# Patient Record
Sex: Female | Born: 1945 | Race: Black or African American | Hispanic: No | Smoking: Never smoker
Health system: Southern US, Community
[De-identification: ages and names within clinical notes are randomized; demographics above are authoritative.]

## PROBLEM LIST (undated history)

## (undated) DIAGNOSIS — I350 Nonrheumatic aortic (valve) stenosis: Secondary | ICD-10-CM

## (undated) DIAGNOSIS — I10 Essential (primary) hypertension: Secondary | ICD-10-CM

## (undated) DIAGNOSIS — M1990 Unspecified osteoarthritis, unspecified site: Secondary | ICD-10-CM

## (undated) DIAGNOSIS — I214 Non-ST elevation (NSTEMI) myocardial infarction: Secondary | ICD-10-CM

## (undated) DIAGNOSIS — K5790 Diverticulosis of intestine, part unspecified, without perforation or abscess without bleeding: Secondary | ICD-10-CM

## (undated) DIAGNOSIS — I639 Cerebral infarction, unspecified: Secondary | ICD-10-CM

## (undated) DIAGNOSIS — E119 Type 2 diabetes mellitus without complications: Secondary | ICD-10-CM

## (undated) HISTORY — PX: ABDOMINAL SURGERY: SHX537

---

## 2007-04-26 ENCOUNTER — Emergency Department (HOSPITAL_COMMUNITY): Admission: EM | Admit: 2007-04-26 | Discharge: 2007-04-26 | Payer: Self-pay | Admitting: Emergency Medicine

## 2008-02-03 ENCOUNTER — Ambulatory Visit (HOSPITAL_BASED_OUTPATIENT_CLINIC_OR_DEPARTMENT_OTHER): Admission: RE | Admit: 2008-02-03 | Discharge: 2008-02-03 | Payer: Self-pay | Admitting: Internal Medicine

## 2011-04-19 LAB — URINALYSIS, ROUTINE W REFLEX MICROSCOPIC
Bilirubin Urine: NEGATIVE
Glucose, UA: NEGATIVE
Hgb urine dipstick: NEGATIVE
Ketones, ur: NEGATIVE
Nitrite: NEGATIVE
Protein, ur: NEGATIVE
Specific Gravity, Urine: 1.019
Urobilinogen, UA: 0.2
pH: 5

## 2011-04-19 LAB — I-STAT 8, (EC8 V) (CONVERTED LAB)
Bicarbonate: 26.1 — ABNORMAL HIGH
Glucose, Bld: 87
Operator id: 291361
Potassium: 4.4
pCO2, Ven: 47.6

## 2011-04-19 LAB — URINE MICROSCOPIC-ADD ON

## 2011-04-19 LAB — POCT CARDIAC MARKERS
CKMB, poc: 1.1
CKMB, poc: <1 — ABNORMAL LOW
Myoglobin, poc: 72.8
Operator id: 291361
Troponin i, poc: <0.05
Troponin i, poc: <0.05

## 2011-04-19 LAB — POCT I-STAT CREATININE: Operator id: 291361

## 2012-06-01 ENCOUNTER — Encounter (HOSPITAL_BASED_OUTPATIENT_CLINIC_OR_DEPARTMENT_OTHER): Payer: Self-pay

## 2012-06-01 ENCOUNTER — Emergency Department (HOSPITAL_BASED_OUTPATIENT_CLINIC_OR_DEPARTMENT_OTHER): Payer: Medicare Other

## 2012-06-01 ENCOUNTER — Emergency Department (HOSPITAL_BASED_OUTPATIENT_CLINIC_OR_DEPARTMENT_OTHER)
Admission: EM | Admit: 2012-06-01 | Discharge: 2012-06-01 | Disposition: A | Discharge status: Home / Self Care | Payer: Medicare Other | Attending: Emergency Medicine | Admitting: Emergency Medicine

## 2012-06-01 DIAGNOSIS — Z8719 Personal history of other diseases of the digestive system: Secondary | ICD-10-CM | POA: Insufficient documentation

## 2012-06-01 DIAGNOSIS — J029 Acute pharyngitis, unspecified: Secondary | ICD-10-CM | POA: Insufficient documentation

## 2012-06-01 DIAGNOSIS — Z79899 Other long term (current) drug therapy: Secondary | ICD-10-CM | POA: Insufficient documentation

## 2012-06-01 DIAGNOSIS — Z8739 Personal history of other diseases of the musculoskeletal system and connective tissue: Secondary | ICD-10-CM | POA: Insufficient documentation

## 2012-06-01 DIAGNOSIS — Z8673 Personal history of transient ischemic attack (TIA), and cerebral infarction without residual deficits: Secondary | ICD-10-CM | POA: Insufficient documentation

## 2012-06-01 DIAGNOSIS — E119 Type 2 diabetes mellitus without complications: Secondary | ICD-10-CM | POA: Insufficient documentation

## 2012-06-01 DIAGNOSIS — J40 Bronchitis, not specified as acute or chronic: Secondary | ICD-10-CM

## 2012-06-01 DIAGNOSIS — I10 Essential (primary) hypertension: Secondary | ICD-10-CM | POA: Insufficient documentation

## 2012-06-01 HISTORY — DX: Diverticulosis of intestine, part unspecified, without perforation or abscess without bleeding: K57.90

## 2012-06-01 HISTORY — DX: Type 2 diabetes mellitus without complications: E11.9

## 2012-06-01 HISTORY — DX: Unspecified osteoarthritis, unspecified site: M19.90

## 2012-06-01 HISTORY — DX: Essential (primary) hypertension: I10

## 2012-06-01 HISTORY — DX: Cerebral infarction, unspecified: I63.9

## 2012-06-01 MED ORDER — ALBUTEROL SULFATE HFA 108 (90 BASE) MCG/ACT IN AERS
2.0000 | INHALATION_SPRAY | RESPIRATORY_TRACT | Status: DC | PRN
  Administered 2012-06-01: 2 via RESPIRATORY_TRACT
  Filled 2012-06-01: qty 6.7

## 2012-06-01 NOTE — ED Notes (Signed)
Pt c/o of cough x 1 week.brownish, thick, along with sore throat,can't sleep

## 2012-06-01 NOTE — ED Provider Notes (Signed)
History     CSN: [id number]  Arrival date & time 06/01/12  0403   First MD Initiated Contact with Patient 06/01/12 [phone]      Chief Complaint  Patient presents with  . Cough    (Consider location/radiation/quality/duration/timing/severity/associated sxs/prior treatment) HPI Pt presents with c/o cough and sore throat.  She states she has had cough productive of thick sputum over the past week. No fever.  Has felt that her voice is hoarse but states this is improving.  No shortness of breath, no chest pain.  Has been drinking plenty of liquids, no vomiting or change in appetite.  Was having a difficult time sleeping tonight due to cough.  There are no other associated systemic symptoms, there are no other alleviating or modifying factors.   Past Medical History  Diagnosis Date  . Diabetes mellitus without complication   . Hypertension   . Stroke   . Arthritis   . Diverticulosis     History reviewed. No pertinent past surgical history.  No family history on file.  History  Substance Use Topics  . Smoking status: Not on file  . Smokeless tobacco: Not on file  . Alcohol Use:     OB History    Grav Para Term Preterm Abortions TAB SAB Ect Mult Living                  Review of Systems ROS reviewed and all otherwise negative except for mentioned in HPI  Allergies  Shrimp and Penicillins  Home Medications   Current Outpatient Rx  Name  Route  Sig  Dispense  Refill  . METFORMIN HCL 500 MG PO TABS   Oral   Take 500 mg by mouth 2 (two) times daily with a meal.           BP 145/85  Temp 98.6 F (37 C) (Oral)  Resp 24  Ht 5\' 4"  (1.626 m)  Wt 237 lb (107.502 kg)  BMI 40.68 kg/m2  SpO2 98% Vitals reviewed Physical Exam Physical Examination: General appearance - alert, well appearing, and in no distress Mental status - alert, oriented to person, place, and time Eyes - no scleral icterus, no conjunctival injection Mouth - mucous membranes moist, pharynx normal  without lesions Chest - clear to auscultation, no wheezes, rales or rhonchi, symmetric air entry, no increased respiratory effort Heart - normal rate, regular rhythm, normal S1, S2, no murmurs, rubs, clicks or gallops Abdomen - soft, nontender, nondistended, no masses or organomegaly Extremities - peripheral pulses normal, no pedal edema, no clubbing or cyanosis Skin - normal coloration and turgor, no rashes  ED Course  Procedures (including critical care time)  Labs Reviewed - No data to display Dg Chest 2 View  06/01/2012  *RADIOLOGY REPORT*  Clinical Data: Cough for 1 week.  Sore throat.  CHEST - 2 VIEW  Comparison: 04/1978 pain  Findings: Shallow inspiration. The heart size and pulmonary vascularity are normal. The lungs appear clear and expanded without focal air space disease or consolidation. No blunting of the costophrenic angles.  No pneumothorax.  Mediastinal contours appear intact.  Degenerative changes in the thoracic spine with anterior wedging of a mid thoracic vertebra, stable since previous study.  IMPRESSION: No evidence of active pulmonary disease.   Original Report Authenticated By: Burman Nieves, M.D.      1. Bronchitis       MDM  Pt presenting with c/o productive cough over the past week- overall nontoxic appearing with clear lungs  and no increased respiratory effort.  CXR reassuring- images reviewed by me as well.  Pt started on albuterol inhaler for likely bronchitis.  She will arrange for PMD follow upin the next 2-3 days if symptoms persist.         Ethelda Chick, MD 06/01/12 [phone]

## 2015-09-09 ENCOUNTER — Encounter (HOSPITAL_COMMUNITY): Payer: Self-pay | Admitting: Emergency Medicine

## 2015-09-09 ENCOUNTER — Emergency Department (HOSPITAL_COMMUNITY)
Admission: EM | Admit: 2015-09-09 | Discharge: 2015-09-09 | Disposition: A | Discharge status: Home / Self Care | Payer: Commercial Managed Care - HMO | Attending: Emergency Medicine | Admitting: Emergency Medicine

## 2015-09-09 ENCOUNTER — Emergency Department (HOSPITAL_COMMUNITY): Payer: Commercial Managed Care - HMO

## 2015-09-09 DIAGNOSIS — J209 Acute bronchitis, unspecified: Secondary | ICD-10-CM | POA: Insufficient documentation

## 2015-09-09 DIAGNOSIS — Z8673 Personal history of transient ischemic attack (TIA), and cerebral infarction without residual deficits: Secondary | ICD-10-CM | POA: Diagnosis not present

## 2015-09-09 DIAGNOSIS — R531 Weakness: Secondary | ICD-10-CM

## 2015-09-09 DIAGNOSIS — R011 Cardiac murmur, unspecified: Secondary | ICD-10-CM | POA: Diagnosis not present

## 2015-09-09 DIAGNOSIS — E119 Type 2 diabetes mellitus without complications: Secondary | ICD-10-CM | POA: Insufficient documentation

## 2015-09-09 DIAGNOSIS — Z79899 Other long term (current) drug therapy: Secondary | ICD-10-CM | POA: Diagnosis not present

## 2015-09-09 DIAGNOSIS — R61 Generalized hyperhidrosis: Secondary | ICD-10-CM | POA: Diagnosis not present

## 2015-09-09 DIAGNOSIS — R05 Cough: Secondary | ICD-10-CM | POA: Diagnosis not present

## 2015-09-09 DIAGNOSIS — R509 Fever, unspecified: Secondary | ICD-10-CM | POA: Diagnosis not present

## 2015-09-09 DIAGNOSIS — I10 Essential (primary) hypertension: Secondary | ICD-10-CM | POA: Diagnosis not present

## 2015-09-09 DIAGNOSIS — Z8719 Personal history of other diseases of the digestive system: Secondary | ICD-10-CM | POA: Diagnosis not present

## 2015-09-09 DIAGNOSIS — Z880 Allergy status to penicillin: Secondary | ICD-10-CM | POA: Diagnosis not present

## 2015-09-09 DIAGNOSIS — Z7984 Long term (current) use of oral hypoglycemic drugs: Secondary | ICD-10-CM | POA: Insufficient documentation

## 2015-09-09 DIAGNOSIS — Z8739 Personal history of other diseases of the musculoskeletal system and connective tissue: Secondary | ICD-10-CM | POA: Insufficient documentation

## 2015-09-09 DIAGNOSIS — R404 Transient alteration of awareness: Secondary | ICD-10-CM | POA: Diagnosis not present

## 2015-09-09 DIAGNOSIS — J40 Bronchitis, not specified as acute or chronic: Secondary | ICD-10-CM

## 2015-09-09 LAB — CBC WITH DIFFERENTIAL/PLATELET
Basophils Absolute: 0 10*3/uL (ref 0.0–0.1)
Basophils Relative: 0 %
EOS PCT: 3 %
Eosinophils Absolute: 0.2 10*3/uL (ref 0.0–0.7)
HCT: 40.2 % (ref 36.0–46.0)
Hemoglobin: 13.7 g/dL (ref 12.0–15.0)
LYMPHS ABS: 2.3 10*3/uL (ref 0.7–4.0)
Lymphocytes Relative: 34 %
MCH: 27.7 pg (ref 26.0–34.0)
MCHC: 34.1 g/dL (ref 30.0–36.0)
MCV: 81.4 fL (ref 78.0–100.0)
MONO ABS: 0.3 10*3/uL (ref 0.1–1.0)
Monocytes Relative: 5 %
Neutro Abs: 4 10*3/uL (ref 1.7–7.7)
Neutrophils Relative %: 58 %
PLATELETS: 277 10*3/uL (ref 150–400)
RBC: 4.94 MIL/uL (ref 3.87–5.11)
RDW: 15.1 % (ref 11.5–15.5)
WBC: 6.8 10*3/uL (ref 4.0–10.5)

## 2015-09-09 LAB — URINALYSIS, ROUTINE W REFLEX MICROSCOPIC
Bilirubin Urine: NEGATIVE
Glucose, UA: NEGATIVE mg/dL
Hgb urine dipstick: NEGATIVE
Ketones, ur: NEGATIVE mg/dL
LEUKOCYTES UA: NEGATIVE
NITRITE: NEGATIVE
Protein, ur: NEGATIVE mg/dL
SPECIFIC GRAVITY, URINE: 1.01 (ref 1.005–1.030)
pH: 7.5 (ref 5.0–8.0)

## 2015-09-09 LAB — COMPREHENSIVE METABOLIC PANEL
ALT: 17 U/L (ref 14–54)
AST: 23 U/L (ref 15–41)
Albumin: 3.7 g/dL (ref 3.5–5.0)
Alkaline Phosphatase: 121 U/L (ref 38–126)
Anion gap: 12 (ref 5–15)
BUN: 13 mg/dL (ref 6–20)
CHLORIDE: 105 mmol/L (ref 101–111)
CO2: 24 mmol/L (ref 22–32)
Calcium: 9.7 mg/dL (ref 8.9–10.3)
Creatinine, Ser: 0.99 mg/dL (ref 0.44–1.00)
GFR, EST NON AFRICAN AMERICAN: 57 mL/min — AB (ref >60)
Glucose, Bld: 103 mg/dL — ABNORMAL HIGH (ref 65–99)
POTASSIUM: 3.8 mmol/L (ref 3.5–5.1)
Sodium: 141 mmol/L (ref 135–145)
Total Bilirubin: 0.3 mg/dL (ref 0.3–1.2)
Total Protein: 8.1 g/dL (ref 6.5–8.1)

## 2015-09-09 LAB — TROPONIN I

## 2015-09-09 MED ORDER — AZITHROMYCIN 250 MG PO TABS: 250.0000 mg | ORAL_TABLET | Freq: Every day | ORAL | Status: DC

## 2015-09-09 MED ORDER — SODIUM CHLORIDE 0.9 % IV BOLUS (SEPSIS)
500.0000 mL | Freq: Once | INTRAVENOUS | Status: AC
  Administered 2015-09-09: 500 mL via INTRAVENOUS

## 2015-09-09 NOTE — ED Notes (Signed)
Pt ambulated to bathroom with stand by assist.  

## 2015-09-09 NOTE — ED Notes (Signed)
Patient transported to X-ray 

## 2015-09-09 NOTE — ED Notes (Signed)
PT reports chills, body aches, and weakness for 2 weeks. Non-productive cough. PT reports intermittent slight chest pain and intermittent slight SOB. PT reports no chest pain or SOB at this time. PT denies NVD

## 2015-09-09 NOTE — Discharge Instructions (Signed)
Read the information below.  Use the prescribed medication as directed.  Please discuss all new medications with your pharmacist.  You may return to the Emergency Department at any time for worsening condition or any new symptoms that concern you.   If you develop high fevers that do not resolve with tylenol or ibuprofen, you have difficulty swallowing or breathing, or you are unable to tolerate fluids by mouth, return to the ER for a recheck.      Weakness Weakness is a lack of strength. It may be felt all over the body (generalized) or in one specific part of the body (focal). Some causes of weakness can be serious. You may need further medical evaluation, especially if you are elderly or you have a history of immunosuppression (such as chemotherapy or HIV), kidney disease, heart disease, or diabetes. CAUSES  Weakness can be caused by many different things, including:  Infection.  Physical exhaustion.  Internal bleeding or other blood loss that results in a lack of red blood cells (anemia).  Dehydration. This cause is more common in elderly people.  Side effects or electrolyte abnormalities from medicines, such as pain medicines or sedatives.  Emotional distress, anxiety, or depression.  Circulation problems, especially severe peripheral arterial disease.  Heart disease, such as rapid atrial fibrillation, bradycardia, or heart failure.  Nervous system disorders, such as Guillain-Barr syndrome, multiple sclerosis, or stroke. DIAGNOSIS  To find the cause of your weakness, your caregiver will take your history and perform a physical exam. Lab tests or X-rays may also be ordered, if needed. TREATMENT  Treatment of weakness depends on the cause of your symptoms and can vary greatly. HOME CARE INSTRUCTIONS   Rest as needed.  Eat a well-balanced diet.  Try to get some exercise every day.  Only take over-the-counter or prescription medicines as directed by your caregiver. SEEK  MEDICAL CARE IF:   Your weakness seems to be getting worse or spreads to other parts of your body.  You develop new aches or pains. SEEK IMMEDIATE MEDICAL CARE IF:   You cannot perform your normal daily activities, such as getting dressed and feeding yourself.  You cannot walk up and down stairs, or you feel exhausted when you do so.  You have shortness of breath or chest pain.  You have difficulty moving parts of your body.  You have weakness in only one area of the body or on only one side of the body.  You have a fever.  You have trouble speaking or swallowing.  You cannot control your bladder or bowel movements.  You have black or bloody vomit or stools. MAKE SURE YOU:  Understand these instructions.  Will watch your condition.  Will get help right away if you are not doing well or get worse.   This information is not intended to replace advice given to you by your health care provider. Make sure you discuss any questions you have with your health care provider.   Document Released: 06/26/2005 Document Revised: 12/26/2011 Document Reviewed: 08/25/2011 Elsevier Interactive Patient Education 2016 [employer]  Upper Respiratory Infection, Adult Most upper respiratory infections (URIs) are a viral infection of the air passages leading to the lungs. A URI affects the nose, throat, and upper air passages. The most common type of URI is nasopharyngitis and is typically referred to as "the common cold." URIs run their course and usually go away on their own. Most of the time, a URI does not require medical attention, but sometimes  a bacterial infection in the upper airways can follow a viral infection. This is called a secondary infection. Sinus and middle ear infections are common types of secondary upper respiratory infections. Bacterial pneumonia can also complicate a URI. A URI can worsen asthma and chronic obstructive pulmonary disease (COPD). Sometimes, these  complications can require emergency medical care and may be life threatening.  CAUSES Almost all URIs are caused by viruses. A virus is a type of germ and can spread from one person to another.  RISKS FACTORS You may be at risk for a URI if:   You smoke.   You have chronic heart or lung disease.  You have a weakened defense (immune) system.   You are very young or very old.   You have nasal allergies or asthma.  You work in crowded or poorly ventilated areas.  You work in health care facilities or schools. SIGNS AND SYMPTOMS  Symptoms typically develop 2-3 days after you come in contact with a cold virus. Most viral URIs last 7-10 days. However, viral URIs from the influenza virus (flu virus) can last 14-18 days and are typically more severe. Symptoms may include:   Runny or stuffy (congested) nose.   Sneezing.   Cough.   Sore throat.   Headache.   Fatigue.   Fever.   Loss of appetite.   Pain in your forehead, behind your eyes, and over your cheekbones (sinus pain).  Muscle aches.  DIAGNOSIS  Your health care provider may diagnose a URI by:  Physical exam.  Tests to check that your symptoms are not due to another condition such as:  Strep throat.  Sinusitis.  Pneumonia.  Asthma. TREATMENT  A URI goes away on its own with time. It cannot be cured with medicines, but medicines may be prescribed or recommended to relieve symptoms. Medicines may help:  Reduce your fever.  Reduce your cough.  Relieve nasal congestion. HOME CARE INSTRUCTIONS   Take medicines only as directed by your health care provider.   Gargle warm saltwater or take cough drops to comfort your throat as directed by your health care provider.  Use a warm mist humidifier or inhale steam from a shower to increase air moisture. This may make it easier to breathe.  Drink enough fluid to keep your urine clear or pale yellow.   Eat soups and other clear broths and maintain  good nutrition.   Rest as needed.   Return to work when your temperature has returned to normal or as your health care provider advises. You may need to stay home longer to avoid infecting others. You can also use a face mask and careful hand washing to prevent spread of the virus.  Increase the usage of your inhaler if you have asthma.   Do not use any tobacco products, including cigarettes, chewing tobacco, or electronic cigarettes. If you need help quitting, ask your health care provider. PREVENTION  The best way to protect yourself from getting a cold is to practice good hygiene.   Avoid oral or hand contact with people with cold symptoms.   Wash your hands often if contact occurs.  There is no clear evidence that vitamin C, vitamin E, echinacea, or exercise reduces the chance of developing a cold. However, it is always recommended to get plenty of rest, exercise, and practice good nutrition.  SEEK MEDICAL CARE IF:   You are getting worse rather than better.   Your symptoms are not controlled by medicine.  You have chills.  You have worsening shortness of breath.  You have brown or red mucus.  You have yellow or brown nasal discharge.  You have pain in your face, especially when you bend forward.  You have a fever.  You have swollen neck glands.  You have pain while swallowing.  You have white areas in the back of your throat. SEEK IMMEDIATE MEDICAL CARE IF:   You have severe or persistent:  Headache.  Ear pain.  Sinus pain.  Chest pain.  You have chronic lung disease and any of the following:  Wheezing.  Prolonged cough.  Coughing up blood.  A change in your usual mucus.  You have a stiff neck.  You have changes in your:  Vision.  Hearing.  Thinking.  Mood. MAKE SURE YOU:   Understand these instructions.  Will watch your condition.  Will get help right away if you are not doing well or get worse.   This information is not  intended to replace advice given to you by your health care provider. Make sure you discuss any questions you have with your health care provider.   Document Released: 12/20/2000 Document Revised: 11/10/2014 Document Reviewed: 10/01/2013 Elsevier Interactive Patient Education [employer]

## 2015-09-09 NOTE — ED Provider Notes (Signed)
CSN: [id number]     Arrival date & time 09/09/15  1420 History   First MD Initiated Contact with Patient 09/09/15 1440     Chief Complaint  Patient presents with  . Weakness    chills, body aches     (Consider location/radiation/quality/duration/timing/severity/associated sxs/prior Treatment) HPI   Pt with hx DM, HTN, stroke p/w 10 days of generalized weakness, sweats, dry cough, nasal congestion, sore throat.  Few episodes of subjective fever last week.  This morning she was significantly weaker than she had been, decided to come to ED.  Daughter has been sick with similar, all developed after going to a baby shower.  Denies CP, SOB, N/V, bowel changes, urinary symptoms.  She did have flu vx this season.    Past Medical History  Diagnosis Date  . Diabetes mellitus without complication (HCC)   . Hypertension   . Stroke (HCC)   . Arthritis   . Diverticulosis    Past Surgical History  Procedure Laterality Date  . Abdominal surgery     No family history on file. Social History  Substance Use Topics  . Smoking status: Never Smoker   . Smokeless tobacco: None  . Alcohol Use: No   OB History    No data available     Review of Systems  All other systems reviewed and are negative.     Allergies  Shrimp and Penicillins  Home Medications   Prior to Admission medications   Medication Sig Start Date End Date Taking? Authorizing Provider  amLODipine (NORVASC) 5 MG tablet Take 5 mg by mouth daily.   Yes Historical Provider, MD  glipiZIDE (GLUCOTROL XL) 5 MG 24 hr tablet Take 5 mg by mouth daily with breakfast.   Yes Historical Provider, MD   BP 154/85 mmHg  Pulse 73  Temp(Src) 98.3 F (36.8 C)  Resp 15  Ht  (1.626 m)  Wt 104.327 kg  BMI 39.46 kg/m2  SpO2 99% Physical Exam  Constitutional: She appears well-developed and well-nourished. No distress.  HENT:  Head: Normocephalic and atraumatic.  Eyes: Conjunctivae are normal.  Neck: Normal range of motion. Neck  supple.  Cardiovascular: Normal rate and regular rhythm.   Murmur heard. Pulmonary/Chest: Effort normal and breath sounds normal. No respiratory distress. She has no wheezes. She has no rales.  Abdominal: Soft. She exhibits no distension. There is no tenderness. There is no rebound and no guarding.  Musculoskeletal: She exhibits no edema.  Neurological: She is alert.  Skin: She is not diaphoretic.  Nursing note and vitals reviewed.   ED Course  Procedures (including critical care time) Labs Review Labs Reviewed  COMPREHENSIVE METABOLIC PANEL - Abnormal; Notable for the following:    Glucose, Bld 103 (*)    GFR calc non Af Amer 57 (*)    All other components within normal limits  URINE CULTURE  CBC WITH DIFFERENTIAL/PLATELET  URINALYSIS, ROUTINE W REFLEX MICROSCOPIC (NOT AT [facility])  TROPONIN I    Imaging Review Dg Chest 2 View  09/09/2015  CLINICAL DATA:  Fever, congestion, nonproductive cough, weakness EXAM: CHEST  2 VIEW COMPARISON:  06/01/2012 FINDINGS: Cardiomediastinal silhouette is stable. No infiltrate or pleural effusion. No pulmonary edema. Mild basilar atelectasis right greater than left. Degenerative changes mid and lower thoracic spine. IMPRESSION: No infiltrate or pulmonary edema. Mild basilar atelectasis right greater than left. Degenerative changes thoracic spine. Electronically Signed   By: Natasha Mead M.D.   On: 09/09/2015 15:45   I have personally reviewed  and evaluated these images and lab results as part of my medical decision-making.   EKG Interpretation   Date/Time:  Thursday September 09 2015 14:28:29 EST Ventricular Rate:  74 PR Interval:  176 QRS Duration: 93 QT Interval:  382 QTC Calculation: 424 R Axis:   70 Text Interpretation:  Sinus rhythm No significant change was found  Confirmed by CAMPOS  MD, KEVIN ([id number]) on 09/09/2015 3:49:50 PM       6:17 PM Pt reports feeling much better.  Ambulated without difficulty, eating and drinking.    MDM   Final  diagnoses:  None    Afebrile, nontoxic patient with constellation of symptoms suggestive of viral syndrome with prolonged course in the setting of diabetes.  Daughter also sick with similar symptoms but has improved.  No concerning findings on exam - pt does have murmur on exam, unsure if this is a new finding, pt unaware of this previously.  Recommended cardiology follow up.  No syncope, CP, SOB per patient.  Discussed pt with Dr Patria Mane.  Discharged home with z-pak, PCP follow up.  Discussed result, findings, treatment, and follow up  with patient.  Pt given return precautions.  Pt verbalizes understanding and agrees with plan.        Trixie Dredge, PA-C 09/09/15 1828  Azalia Bilis, MD 09/10/15 [phone]

## 2015-09-09 NOTE — ED Notes (Signed)
Ambulated pt in the hallway pt ambulated well no complaints noted at this time 

## 2015-09-10 LAB — URINE CULTURE

## 2015-09-13 DIAGNOSIS — E119 Type 2 diabetes mellitus without complications: Secondary | ICD-10-CM | POA: Diagnosis not present

## 2015-09-13 DIAGNOSIS — E785 Hyperlipidemia, unspecified: Secondary | ICD-10-CM | POA: Diagnosis not present

## 2015-09-13 DIAGNOSIS — I10 Essential (primary) hypertension: Secondary | ICD-10-CM | POA: Diagnosis not present

## 2015-09-13 DIAGNOSIS — R011 Cardiac murmur, unspecified: Secondary | ICD-10-CM | POA: Diagnosis not present

## 2015-09-13 DIAGNOSIS — K219 Gastro-esophageal reflux disease without esophagitis: Secondary | ICD-10-CM | POA: Diagnosis not present

## 2015-09-13 DIAGNOSIS — M545 Low back pain: Secondary | ICD-10-CM | POA: Diagnosis not present

## 2015-09-27 DIAGNOSIS — I10 Essential (primary) hypertension: Secondary | ICD-10-CM | POA: Diagnosis not present

## 2015-09-27 DIAGNOSIS — K219 Gastro-esophageal reflux disease without esophagitis: Secondary | ICD-10-CM | POA: Diagnosis not present

## 2015-09-27 DIAGNOSIS — E785 Hyperlipidemia, unspecified: Secondary | ICD-10-CM | POA: Diagnosis not present

## 2015-09-27 DIAGNOSIS — M545 Low back pain: Secondary | ICD-10-CM | POA: Diagnosis not present

## 2015-09-27 DIAGNOSIS — R011 Cardiac murmur, unspecified: Secondary | ICD-10-CM | POA: Diagnosis not present

## 2015-09-27 DIAGNOSIS — E119 Type 2 diabetes mellitus without complications: Secondary | ICD-10-CM | POA: Diagnosis not present

## 2015-12-03 DIAGNOSIS — E785 Hyperlipidemia, unspecified: Secondary | ICD-10-CM | POA: Diagnosis not present

## 2015-12-03 DIAGNOSIS — R011 Cardiac murmur, unspecified: Secondary | ICD-10-CM | POA: Diagnosis not present

## 2015-12-03 DIAGNOSIS — I10 Essential (primary) hypertension: Secondary | ICD-10-CM | POA: Diagnosis not present

## 2015-12-03 DIAGNOSIS — K219 Gastro-esophageal reflux disease without esophagitis: Secondary | ICD-10-CM | POA: Diagnosis not present

## 2015-12-03 DIAGNOSIS — M545 Low back pain: Secondary | ICD-10-CM | POA: Diagnosis not present

## 2015-12-03 DIAGNOSIS — E119 Type 2 diabetes mellitus without complications: Secondary | ICD-10-CM | POA: Diagnosis not present

## 2016-01-09 DIAGNOSIS — E669 Obesity, unspecified: Secondary | ICD-10-CM | POA: Diagnosis not present

## 2016-01-09 DIAGNOSIS — M19041 Primary osteoarthritis, right hand: Secondary | ICD-10-CM | POA: Diagnosis not present

## 2016-01-09 DIAGNOSIS — Z8673 Personal history of transient ischemic attack (TIA), and cerebral infarction without residual deficits: Secondary | ICD-10-CM | POA: Diagnosis not present

## 2016-01-09 DIAGNOSIS — M19042 Primary osteoarthritis, left hand: Secondary | ICD-10-CM | POA: Diagnosis not present

## 2016-01-09 DIAGNOSIS — Z6839 Body mass index (BMI) 39.0-39.9, adult: Secondary | ICD-10-CM | POA: Diagnosis not present

## 2016-01-09 DIAGNOSIS — K219 Gastro-esophageal reflux disease without esophagitis: Secondary | ICD-10-CM | POA: Diagnosis not present

## 2016-01-09 DIAGNOSIS — E119 Type 2 diabetes mellitus without complications: Secondary | ICD-10-CM | POA: Diagnosis not present

## 2016-01-09 DIAGNOSIS — E785 Hyperlipidemia, unspecified: Secondary | ICD-10-CM | POA: Diagnosis not present

## 2016-01-09 DIAGNOSIS — I10 Essential (primary) hypertension: Secondary | ICD-10-CM | POA: Diagnosis not present

## 2016-03-14 DIAGNOSIS — Z0000 Encounter for general adult medical examination without abnormal findings: Secondary | ICD-10-CM | POA: Diagnosis not present

## 2016-03-14 DIAGNOSIS — K219 Gastro-esophageal reflux disease without esophagitis: Secondary | ICD-10-CM | POA: Diagnosis not present

## 2016-03-14 DIAGNOSIS — E785 Hyperlipidemia, unspecified: Secondary | ICD-10-CM | POA: Diagnosis not present

## 2016-03-14 DIAGNOSIS — H538 Other visual disturbances: Secondary | ICD-10-CM | POA: Diagnosis not present

## 2016-03-14 DIAGNOSIS — E119 Type 2 diabetes mellitus without complications: Secondary | ICD-10-CM | POA: Diagnosis not present

## 2016-03-14 DIAGNOSIS — Z01118 Encounter for examination of ears and hearing with other abnormal findings: Secondary | ICD-10-CM | POA: Diagnosis not present

## 2016-03-14 DIAGNOSIS — I10 Essential (primary) hypertension: Secondary | ICD-10-CM | POA: Diagnosis not present

## 2016-03-14 DIAGNOSIS — M545 Low back pain: Secondary | ICD-10-CM | POA: Diagnosis not present

## 2016-05-23 DIAGNOSIS — F329 Major depressive disorder, single episode, unspecified: Secondary | ICD-10-CM | POA: Diagnosis not present

## 2016-05-23 DIAGNOSIS — E785 Hyperlipidemia, unspecified: Secondary | ICD-10-CM | POA: Diagnosis not present

## 2016-05-23 DIAGNOSIS — I10 Essential (primary) hypertension: Secondary | ICD-10-CM | POA: Diagnosis not present

## 2016-05-23 DIAGNOSIS — K219 Gastro-esophageal reflux disease without esophagitis: Secondary | ICD-10-CM | POA: Diagnosis not present

## 2016-05-23 DIAGNOSIS — M545 Low back pain: Secondary | ICD-10-CM | POA: Diagnosis not present

## 2016-05-23 DIAGNOSIS — E119 Type 2 diabetes mellitus without complications: Secondary | ICD-10-CM | POA: Diagnosis not present

## 2016-08-29 DIAGNOSIS — E785 Hyperlipidemia, unspecified: Secondary | ICD-10-CM | POA: Diagnosis not present

## 2016-08-29 DIAGNOSIS — M545 Low back pain: Secondary | ICD-10-CM | POA: Diagnosis not present

## 2016-08-29 DIAGNOSIS — F329 Major depressive disorder, single episode, unspecified: Secondary | ICD-10-CM | POA: Diagnosis not present

## 2016-08-29 DIAGNOSIS — Z0000 Encounter for general adult medical examination without abnormal findings: Secondary | ICD-10-CM | POA: Diagnosis not present

## 2016-08-29 DIAGNOSIS — K219 Gastro-esophageal reflux disease without esophagitis: Secondary | ICD-10-CM | POA: Diagnosis not present

## 2016-08-29 DIAGNOSIS — I10 Essential (primary) hypertension: Secondary | ICD-10-CM | POA: Diagnosis not present

## 2016-08-29 DIAGNOSIS — E119 Type 2 diabetes mellitus without complications: Secondary | ICD-10-CM | POA: Diagnosis not present

## 2016-09-26 DIAGNOSIS — K219 Gastro-esophageal reflux disease without esophagitis: Secondary | ICD-10-CM | POA: Diagnosis not present

## 2016-09-26 DIAGNOSIS — F329 Major depressive disorder, single episode, unspecified: Secondary | ICD-10-CM | POA: Diagnosis not present

## 2016-09-26 DIAGNOSIS — E119 Type 2 diabetes mellitus without complications: Secondary | ICD-10-CM | POA: Diagnosis not present

## 2016-09-26 DIAGNOSIS — E785 Hyperlipidemia, unspecified: Secondary | ICD-10-CM | POA: Diagnosis not present

## 2016-09-26 DIAGNOSIS — I10 Essential (primary) hypertension: Secondary | ICD-10-CM | POA: Diagnosis not present

## 2016-09-26 DIAGNOSIS — M545 Low back pain: Secondary | ICD-10-CM | POA: Diagnosis not present

## 2016-11-21 DIAGNOSIS — H2513 Age-related nuclear cataract, bilateral: Secondary | ICD-10-CM | POA: Diagnosis not present

## 2016-11-21 DIAGNOSIS — H40033 Anatomical narrow angle, bilateral: Secondary | ICD-10-CM | POA: Diagnosis not present

## 2016-11-27 DIAGNOSIS — H43393 Other vitreous opacities, bilateral: Secondary | ICD-10-CM | POA: Diagnosis not present

## 2017-02-13 DIAGNOSIS — F329 Major depressive disorder, single episode, unspecified: Secondary | ICD-10-CM | POA: Diagnosis not present

## 2017-02-13 DIAGNOSIS — I10 Essential (primary) hypertension: Secondary | ICD-10-CM | POA: Diagnosis not present

## 2017-02-13 DIAGNOSIS — Z0000 Encounter for general adult medical examination without abnormal findings: Secondary | ICD-10-CM | POA: Diagnosis not present

## 2017-02-13 DIAGNOSIS — Z01118 Encounter for examination of ears and hearing with other abnormal findings: Secondary | ICD-10-CM | POA: Diagnosis not present

## 2017-02-13 DIAGNOSIS — K219 Gastro-esophageal reflux disease without esophagitis: Secondary | ICD-10-CM | POA: Diagnosis not present

## 2017-02-13 DIAGNOSIS — M545 Low back pain: Secondary | ICD-10-CM | POA: Diagnosis not present

## 2017-02-13 DIAGNOSIS — E119 Type 2 diabetes mellitus without complications: Secondary | ICD-10-CM | POA: Diagnosis not present

## 2017-02-13 DIAGNOSIS — E785 Hyperlipidemia, unspecified: Secondary | ICD-10-CM | POA: Diagnosis not present

## 2017-02-13 DIAGNOSIS — Z136 Encounter for screening for cardiovascular disorders: Secondary | ICD-10-CM | POA: Diagnosis not present

## 2017-03-19 DIAGNOSIS — L309 Dermatitis, unspecified: Secondary | ICD-10-CM | POA: Diagnosis not present

## 2017-03-19 DIAGNOSIS — M545 Low back pain: Secondary | ICD-10-CM | POA: Diagnosis not present

## 2017-03-19 DIAGNOSIS — K219 Gastro-esophageal reflux disease without esophagitis: Secondary | ICD-10-CM | POA: Diagnosis not present

## 2017-03-19 DIAGNOSIS — E785 Hyperlipidemia, unspecified: Secondary | ICD-10-CM | POA: Diagnosis not present

## 2017-03-19 DIAGNOSIS — E119 Type 2 diabetes mellitus without complications: Secondary | ICD-10-CM | POA: Diagnosis not present

## 2017-03-19 DIAGNOSIS — F329 Major depressive disorder, single episode, unspecified: Secondary | ICD-10-CM | POA: Diagnosis not present

## 2017-03-19 DIAGNOSIS — I10 Essential (primary) hypertension: Secondary | ICD-10-CM | POA: Diagnosis not present

## 2017-06-25 DIAGNOSIS — M545 Low back pain: Secondary | ICD-10-CM | POA: Diagnosis not present

## 2017-06-25 DIAGNOSIS — E119 Type 2 diabetes mellitus without complications: Secondary | ICD-10-CM | POA: Diagnosis not present

## 2017-06-25 DIAGNOSIS — R5383 Other fatigue: Secondary | ICD-10-CM | POA: Diagnosis not present

## 2017-06-25 DIAGNOSIS — K219 Gastro-esophageal reflux disease without esophagitis: Secondary | ICD-10-CM | POA: Diagnosis not present

## 2017-06-25 DIAGNOSIS — F329 Major depressive disorder, single episode, unspecified: Secondary | ICD-10-CM | POA: Diagnosis not present

## 2017-06-25 DIAGNOSIS — E785 Hyperlipidemia, unspecified: Secondary | ICD-10-CM | POA: Diagnosis not present

## 2017-06-25 DIAGNOSIS — I10 Essential (primary) hypertension: Secondary | ICD-10-CM | POA: Diagnosis not present

## 2017-08-30 DIAGNOSIS — K219 Gastro-esophageal reflux disease without esophagitis: Secondary | ICD-10-CM | POA: Diagnosis not present

## 2017-08-30 DIAGNOSIS — E785 Hyperlipidemia, unspecified: Secondary | ICD-10-CM | POA: Diagnosis not present

## 2017-08-30 DIAGNOSIS — Z0000 Encounter for general adult medical examination without abnormal findings: Secondary | ICD-10-CM | POA: Diagnosis not present

## 2017-08-30 DIAGNOSIS — M545 Low back pain: Secondary | ICD-10-CM | POA: Diagnosis not present

## 2017-08-30 DIAGNOSIS — F329 Major depressive disorder, single episode, unspecified: Secondary | ICD-10-CM | POA: Diagnosis not present

## 2017-08-30 DIAGNOSIS — I10 Essential (primary) hypertension: Secondary | ICD-10-CM | POA: Diagnosis not present

## 2017-08-30 DIAGNOSIS — E119 Type 2 diabetes mellitus without complications: Secondary | ICD-10-CM | POA: Diagnosis not present

## 2017-11-20 DIAGNOSIS — E785 Hyperlipidemia, unspecified: Secondary | ICD-10-CM | POA: Diagnosis not present

## 2017-11-20 DIAGNOSIS — F329 Major depressive disorder, single episode, unspecified: Secondary | ICD-10-CM | POA: Diagnosis not present

## 2017-11-20 DIAGNOSIS — K219 Gastro-esophageal reflux disease without esophagitis: Secondary | ICD-10-CM | POA: Diagnosis not present

## 2017-11-20 DIAGNOSIS — M545 Low back pain: Secondary | ICD-10-CM | POA: Diagnosis not present

## 2017-11-20 DIAGNOSIS — E119 Type 2 diabetes mellitus without complications: Secondary | ICD-10-CM | POA: Diagnosis not present

## 2017-11-20 DIAGNOSIS — I10 Essential (primary) hypertension: Secondary | ICD-10-CM | POA: Diagnosis not present

## 2017-11-20 DIAGNOSIS — Z0000 Encounter for general adult medical examination without abnormal findings: Secondary | ICD-10-CM | POA: Diagnosis not present

## 2017-12-21 DIAGNOSIS — M545 Low back pain: Secondary | ICD-10-CM | POA: Diagnosis not present

## 2017-12-21 DIAGNOSIS — F329 Major depressive disorder, single episode, unspecified: Secondary | ICD-10-CM | POA: Diagnosis not present

## 2017-12-21 DIAGNOSIS — I10 Essential (primary) hypertension: Secondary | ICD-10-CM | POA: Diagnosis not present

## 2017-12-21 DIAGNOSIS — E119 Type 2 diabetes mellitus without complications: Secondary | ICD-10-CM | POA: Diagnosis not present

## 2017-12-21 DIAGNOSIS — E785 Hyperlipidemia, unspecified: Secondary | ICD-10-CM | POA: Diagnosis not present

## 2017-12-21 DIAGNOSIS — K219 Gastro-esophageal reflux disease without esophagitis: Secondary | ICD-10-CM | POA: Diagnosis not present

## 2017-12-28 ENCOUNTER — Encounter (HOSPITAL_COMMUNITY): Payer: Self-pay | Admitting: Emergency Medicine

## 2017-12-28 ENCOUNTER — Other Ambulatory Visit: Payer: Self-pay

## 2017-12-28 ENCOUNTER — Emergency Department (HOSPITAL_COMMUNITY)
Admission: EM | Admit: 2017-12-28 | Discharge: 2017-12-29 | Disposition: A | Discharge status: Home / Self Care | Payer: Medicare HMO | Attending: Emergency Medicine | Admitting: Emergency Medicine

## 2017-12-28 ENCOUNTER — Emergency Department (HOSPITAL_COMMUNITY): Payer: Medicare HMO

## 2017-12-28 DIAGNOSIS — R000 Tachycardia, unspecified: Secondary | ICD-10-CM | POA: Diagnosis present

## 2017-12-28 DIAGNOSIS — I10 Essential (primary) hypertension: Secondary | ICD-10-CM | POA: Insufficient documentation

## 2017-12-28 DIAGNOSIS — R51 Headache: Secondary | ICD-10-CM | POA: Diagnosis not present

## 2017-12-28 DIAGNOSIS — E119 Type 2 diabetes mellitus without complications: Secondary | ICD-10-CM | POA: Insufficient documentation

## 2017-12-28 DIAGNOSIS — Z79899 Other long term (current) drug therapy: Secondary | ICD-10-CM | POA: Insufficient documentation

## 2017-12-28 DIAGNOSIS — R0602 Shortness of breath: Secondary | ICD-10-CM | POA: Diagnosis not present

## 2017-12-28 DIAGNOSIS — R42 Dizziness and giddiness: Secondary | ICD-10-CM | POA: Diagnosis not present

## 2017-12-28 LAB — CBC
HEMATOCRIT: 43.5 % (ref 36.0–46.0)
Hemoglobin: 13.9 g/dL (ref 12.0–15.0)
MCH: 27.1 pg (ref 26.0–34.0)
MCHC: 32 g/dL (ref 30.0–36.0)
MCV: 85 fL (ref 78.0–100.0)
Platelets: 247 10*3/uL (ref 150–400)
RBC: 5.12 MIL/uL — ABNORMAL HIGH (ref 3.87–5.11)
RDW: 15.1 % (ref 11.5–15.5)
WBC: 8 10*3/uL (ref 4.0–10.5)

## 2017-12-28 LAB — BASIC METABOLIC PANEL
ANION GAP: 10 (ref 5–15)
BUN: 15 mg/dL (ref 6–20)
CALCIUM: 9.8 mg/dL (ref 8.9–10.3)
CO2: 24 mmol/L (ref 22–32)
Chloride: 105 mmol/L (ref 101–111)
Creatinine, Ser: 1.11 mg/dL — ABNORMAL HIGH (ref 0.44–1.00)
GFR calc Af Amer: 57 mL/min — ABNORMAL LOW (ref >60)
GFR calc non Af Amer: 49 mL/min — ABNORMAL LOW (ref >60)
Glucose, Bld: 120 mg/dL — ABNORMAL HIGH (ref 65–99)
POTASSIUM: 3.9 mmol/L (ref 3.5–5.1)
SODIUM: 139 mmol/L (ref 135–145)

## 2017-12-28 LAB — I-STAT TROPONIN, ED: TROPONIN I, POC: 0 ng/mL (ref 0.00–0.08)

## 2017-12-28 NOTE — ED Triage Notes (Signed)
C/o blood pressure elevated, heart racing, mild sob, and feeling lightheaded since Tuesday.  Reports intermittent frontal headache and L shoulder pain since yesterday.  Taking blood pressure medication as instructed.

## 2017-12-29 NOTE — Discharge Instructions (Signed)
Return for worsening shortness of breath, chest pain, headache, or new stroke symptoms(one sided weakness, trouble with speech or swallowing, visual changes) Otherwise see you doctor and discuss your blood pressure management.

## 2017-12-29 NOTE — ED Provider Notes (Addendum)
[facility] EMERGENCY DEPARTMENT Provider Note   CSN: [id number] Arrival date & time: 12/28/17  2026     History   Chief Complaint Chief Complaint  Patient presents with  . Tachycardia  . Hypertension    HPI Vicki Rogers is a 72 y.o. female.  72 yo F with a chief complaint of hypertension.  The patient has been checking her blood pressure all week and feels it is been high.  She is been taking her medication as prescribed and it has not gone down.  She has had a very mild left-sided headache that she says is chronic.  She also has had some lightheadedness.  Is usually worse upon standing.  She is also been short of breath on exertion for the past week or so.  She denies any difficulty with speech or swallowing denies unilateral weakness.  Denies chest pain denies abdominal pain vomiting or diarrhea.  Denies diaphoresis nausea or vomiting on exertion.  She has not been eating and drinking because she was worried that it would influence her blood pressure.  The history is provided by the patient.  Hypertension  This is a new problem. The current episode started more than 1 week ago. The problem occurs constantly. The problem has not changed since onset.Associated symptoms include headaches and shortness of breath. Pertinent negatives include no chest pain. Nothing aggravates the symptoms. Nothing relieves the symptoms. She has tried nothing for the symptoms. The treatment provided no relief.    Past Medical History:  Diagnosis Date  . Arthritis   . Diabetes mellitus without complication (HCC)   . Diverticulosis   . Hypertension   . Stroke [facility])     There are no active problems to display for this patient.   Past Surgical History:  Procedure Laterality Date  . ABDOMINAL SURGERY       OB History   None      Home Medications    Prior to Admission medications   Medication Sig Start Date End Date Taking? Authorizing Provider  amLODipine (NORVASC) 5 MG  tablet Take 5 mg by mouth daily.   Yes [provider]  carboxymethylcellul-glycerin (OPTIVE) 0.5-0.9 % ophthalmic solution Place 1 drop into both eyes as needed for dry eyes.   Yes [provider]  glipiZIDE (GLUCOTROL XL) 5 MG 24 hr tablet Take 5 mg by mouth daily with breakfast.   Yes [provider]  oxyCODONE (OXY IR/ROXICODONE) 5 MG immediate release tablet Take 5 mg by mouth daily as needed for pain. 11/20/17  Yes [provider]  azithromycin (ZITHROMAX) 250 MG tablet Take 1 tablet (250 mg total) by mouth daily. Take first 2 tablets together, then 1 every day until finished. Patient not taking: Reported on 12/28/2017 09/09/15   Trixie Dredge, PA-C    Family History No family history on file.  Social History Social History   Tobacco Use  . Smoking status: Never Smoker  . Smokeless tobacco: Never Used  Substance Use Topics  . Alcohol use: No  . Drug use: No     Allergies   Penicillins and Shrimp [shellfish allergy]   Review of Systems Review of Systems  Constitutional: Negative for chills and fever.  HENT: Negative for congestion and rhinorrhea.   Eyes: Negative for redness and visual disturbance.  Respiratory: Positive for shortness of breath. Negative for wheezing.   Cardiovascular: Negative for chest pain and palpitations.  Gastrointestinal: Negative for nausea and vomiting.  Genitourinary: Negative for dysuria and urgency.  Musculoskeletal: Negative for arthralgias and myalgias.  Skin: Negative for pallor and wound.  Neurological: Positive for headaches. Negative for dizziness.     Physical Exam Updated Vital Signs BP 120/70   Pulse 68   Temp 98 F (36.7 C) (Oral)   Resp 14   SpO2 97%   Physical Exam  Constitutional: She is oriented to person, place, and time. She appears well-developed and well-nourished. No distress.  HENT:  Head: Normocephalic and atraumatic.  Eyes: Pupils are equal, round, and reactive to light. EOM  are normal.  Neck: Normal range of motion. Neck supple.  Cardiovascular: Normal rate and regular rhythm. Exam reveals no gallop and no friction rub.  No murmur heard. Pulmonary/Chest: Effort normal. She has no wheezes. She has no rales.  Abdominal: Soft. She exhibits no distension and no mass. There is no tenderness. There is no guarding.  Musculoskeletal: She exhibits no edema or tenderness.  Neurological: She is alert and oriented to person, place, and time. No cranial nerve deficit or sensory deficit. Coordination and gait normal. GCS eye subscore is 4. GCS verbal subscore is 5. GCS motor subscore is 6.  LUE weakness 4/5 compared to right.  Chronic per patient  Skin: Skin is warm and dry. She is not diaphoretic.  Psychiatric: She has a normal mood and affect. Her behavior is normal.  Nursing note and vitals reviewed.    ED Treatments / Results  Labs (all labs ordered are listed, but only abnormal results are displayed) Labs Reviewed  BASIC METABOLIC PANEL - Abnormal; Notable for the following components:      Result Value   Glucose, Bld 120 (*)    Creatinine, Ser 1.11 (*)    GFR calc non Af Amer 49 (*)    GFR calc Af Amer 57 (*)    All other components within normal limits  CBC - Abnormal; Notable for the following components:   RBC 5.12 (*)    All other components within normal limits  I-STAT TROPONIN, ED    EKG EKG Interpretation  Date/Time:  Friday December 28 2017 20:41:00 EDT Ventricular Rate:  76 PR Interval:  182 QRS Duration: 90 QT Interval:  368 QTC Calculation: 414 R Axis:   46 Text Interpretation:  Normal sinus rhythm Normal ECG No significant change was found Confirmed by Azalia Bilis ([id number]) on 12/28/2017 11:26:25 PM   Radiology Dg Chest 2 View  Result Date: 12/28/2017 CLINICAL DATA:  Elevated blood pressure. Heart racing. Shortness of breath, and lightheaded since Tuesday. Frontal headache and left shoulder pain since yesterday. EXAM: CHEST - 2 VIEW  COMPARISON:  09/09/2015 FINDINGS: Normal heart size and pulmonary vascularity. No focal airspace disease or consolidation in the lungs. No blunting of costophrenic angles. No pneumothorax. Mediastinal contours appear intact. Calcification of the aorta. Degenerative changes in the thoracic spine with midthoracic kyphosis. No change since prior study. IMPRESSION: No active cardiopulmonary disease.  Aortic atherosclerosis. Electronically Signed   By: Burman Nieves M.D.   On: 12/28/2017 21:42    Procedures Procedures (including critical care time)  Medications Ordered in ED Medications - No data to display   Initial Impression / Assessment and Plan / ED Course  I have reviewed the triage vital signs and the nursing notes.  Pertinent labs & imaging results that were available during my care of the patient were reviewed by me and considered in my medical decision making (see chart for details).     72 yo F with a chief complaint of  hypertension.  Patient is well-appearing and nontoxic.  Upon my evaluation the room the patient's blood pressure is 130/77.  She had a laboratory evaluation done in triage that was unremarkable, creatinine is at baseline not anemic troponin negative.  I offered to do a repeat troponin but the patient was upset when I told her that I was not can to treat her blood pressure here.  I told her that she needs to follow-up with her family physician or whoever manages her blood pressure and I discussed reasons why we should not treat her blood pressure in the emergency department and discussed the current ACEP policy.  The patient then did not feel she needed to wait for a second troponin.  She will follow-up with her family physician.  2:07 AM:  I have discussed the diagnosis/risks/treatment options with the patient and family and believe the pt to be eligible for discharge home to follow-up with PCP. We also discussed returning to the ED immediately if new or worsening sx  occur. We discussed the sx which are most concerning (e.g., sudden worsening pain, fever, inability to tolerate by mouth) that necessitate immediate return. Medications administered to the patient during their visit and any new prescriptions provided to the patient are listed below.  Medications given during this visit Medications - No data to display    The patient appears reasonably screen and/or stabilized for discharge and I doubt any other medical condition or other [facility] requiring further screening, evaluation, or treatment in the ED at this time prior to discharge.    Final Clinical Impressions(s) / ED Diagnoses   Final diagnoses:  Essential hypertension    ED Discharge Orders    None       Melene PlanFloyd, Tydus Sanmiguel, DO 12/29/17 0207    Melene PlanFloyd, Gardner Servantes, DO 12/29/17 0207

## 2018-04-07 ENCOUNTER — Other Ambulatory Visit: Payer: Self-pay

## 2018-04-07 ENCOUNTER — Encounter (HOSPITAL_COMMUNITY): Payer: Self-pay

## 2018-04-07 ENCOUNTER — Inpatient Hospital Stay (HOSPITAL_COMMUNITY)
Admission: EM | Admit: 2018-04-07 | Discharge: 2018-04-09 | DRG: 247 | Disposition: A | Discharge status: Home Health | Payer: Medicare HMO | Attending: Family Medicine | Admitting: Family Medicine

## 2018-04-07 ENCOUNTER — Emergency Department (HOSPITAL_COMMUNITY): Payer: Medicare HMO

## 2018-04-07 DIAGNOSIS — E669 Obesity, unspecified: Secondary | ICD-10-CM | POA: Diagnosis present

## 2018-04-07 DIAGNOSIS — I214 Non-ST elevation (NSTEMI) myocardial infarction: Secondary | ICD-10-CM | POA: Diagnosis not present

## 2018-04-07 DIAGNOSIS — E118 Type 2 diabetes mellitus with unspecified complications: Secondary | ICD-10-CM

## 2018-04-07 DIAGNOSIS — Z955 Presence of coronary angioplasty implant and graft: Secondary | ICD-10-CM | POA: Diagnosis not present

## 2018-04-07 DIAGNOSIS — I6523 Occlusion and stenosis of bilateral carotid arteries: Secondary | ICD-10-CM | POA: Diagnosis not present

## 2018-04-07 DIAGNOSIS — Z91013 Allergy to seafood: Secondary | ICD-10-CM | POA: Diagnosis not present

## 2018-04-07 DIAGNOSIS — Z880 Allergy status to penicillin: Secondary | ICD-10-CM

## 2018-04-07 DIAGNOSIS — Z8719 Personal history of other diseases of the digestive system: Secondary | ICD-10-CM | POA: Diagnosis not present

## 2018-04-07 DIAGNOSIS — E785 Hyperlipidemia, unspecified: Secondary | ICD-10-CM | POA: Diagnosis not present

## 2018-04-07 DIAGNOSIS — R748 Abnormal levels of other serum enzymes: Secondary | ICD-10-CM

## 2018-04-07 DIAGNOSIS — I700 Atherosclerosis of aorta: Secondary | ICD-10-CM | POA: Diagnosis present

## 2018-04-07 DIAGNOSIS — Z888 Allergy status to other drugs, medicaments and biological substances status: Secondary | ICD-10-CM | POA: Diagnosis not present

## 2018-04-07 DIAGNOSIS — I723 Aneurysm of iliac artery: Secondary | ICD-10-CM | POA: Diagnosis not present

## 2018-04-07 DIAGNOSIS — R1013 Epigastric pain: Secondary | ICD-10-CM | POA: Diagnosis not present

## 2018-04-07 DIAGNOSIS — I350 Nonrheumatic aortic (valve) stenosis: Secondary | ICD-10-CM | POA: Diagnosis present

## 2018-04-07 DIAGNOSIS — I4581 Long QT syndrome: Secondary | ICD-10-CM | POA: Diagnosis present

## 2018-04-07 DIAGNOSIS — Z833 Family history of diabetes mellitus: Secondary | ICD-10-CM

## 2018-04-07 DIAGNOSIS — I213 ST elevation (STEMI) myocardial infarction of unspecified site: Secondary | ICD-10-CM | POA: Diagnosis not present

## 2018-04-07 DIAGNOSIS — K219 Gastro-esophageal reflux disease without esophagitis: Secondary | ICD-10-CM | POA: Diagnosis present

## 2018-04-07 DIAGNOSIS — I719 Aortic aneurysm of unspecified site, without rupture: Secondary | ICD-10-CM

## 2018-04-07 DIAGNOSIS — I491 Atrial premature depolarization: Secondary | ICD-10-CM | POA: Diagnosis not present

## 2018-04-07 DIAGNOSIS — Z7984 Long term (current) use of oral hypoglycemic drugs: Secondary | ICD-10-CM | POA: Diagnosis not present

## 2018-04-07 DIAGNOSIS — R0602 Shortness of breath: Secondary | ICD-10-CM | POA: Diagnosis not present

## 2018-04-07 DIAGNOSIS — Z79899 Other long term (current) drug therapy: Secondary | ICD-10-CM

## 2018-04-07 DIAGNOSIS — E1165 Type 2 diabetes mellitus with hyperglycemia: Secondary | ICD-10-CM | POA: Diagnosis not present

## 2018-04-07 DIAGNOSIS — R079 Chest pain, unspecified: Secondary | ICD-10-CM | POA: Diagnosis present

## 2018-04-07 DIAGNOSIS — R0789 Other chest pain: Secondary | ICD-10-CM

## 2018-04-07 DIAGNOSIS — I69334 Monoplegia of upper limb following cerebral infarction affecting left non-dominant side: Secondary | ICD-10-CM

## 2018-04-07 DIAGNOSIS — Z6834 Body mass index (BMI) 34.0-34.9, adult: Secondary | ICD-10-CM

## 2018-04-07 DIAGNOSIS — I119 Hypertensive heart disease without heart failure: Secondary | ICD-10-CM | POA: Diagnosis present

## 2018-04-07 DIAGNOSIS — I2510 Atherosclerotic heart disease of native coronary artery without angina pectoris: Secondary | ICD-10-CM | POA: Diagnosis not present

## 2018-04-07 HISTORY — DX: Nonrheumatic aortic (valve) stenosis: I35.0

## 2018-04-07 HISTORY — DX: Non-ST elevation (NSTEMI) myocardial infarction: I21.4

## 2018-04-07 LAB — COMPREHENSIVE METABOLIC PANEL
ALBUMIN: 3.7 g/dL (ref 3.5–5.0)
ALT: 16 U/L (ref 0–44)
AST: 22 U/L (ref 15–41)
Alkaline Phosphatase: 119 U/L (ref 38–126)
Anion gap: 9 (ref 5–15)
BILIRUBIN TOTAL: 0.5 mg/dL (ref 0.3–1.2)
BUN: 15 mg/dL (ref 8–23)
CHLORIDE: 104 mmol/L (ref 98–111)
CO2: 25 mmol/L (ref 22–32)
CREATININE: 0.93 mg/dL (ref 0.44–1.00)
Calcium: 9.6 mg/dL (ref 8.9–10.3)
GFR calc Af Amer: >60 mL/min (ref >60)
Glucose, Bld: 165 mg/dL — ABNORMAL HIGH (ref 70–99)
POTASSIUM: 3.8 mmol/L (ref 3.5–5.1)
Sodium: 138 mmol/L (ref 135–145)
Total Protein: 8 g/dL (ref 6.5–8.1)

## 2018-04-07 LAB — CBC WITH DIFFERENTIAL/PLATELET
ABS IMMATURE GRANULOCYTES: 0 10*3/uL (ref 0.0–0.1)
Basophils Absolute: 0 10*3/uL (ref 0.0–0.1)
Basophils Relative: 0 %
Eosinophils Absolute: 0.1 10*3/uL (ref 0.0–0.7)
Eosinophils Relative: 1 %
HCT: 42 % (ref 36.0–46.0)
HEMOGLOBIN: 13.7 g/dL (ref 12.0–15.0)
IMMATURE GRANULOCYTES: 1 %
LYMPHS ABS: 1.6 10*3/uL (ref 0.7–4.0)
LYMPHS PCT: 18 %
MCH: 27.1 pg (ref 26.0–34.0)
MCHC: 32.6 g/dL (ref 30.0–36.0)
MCV: 83 fL (ref 78.0–100.0)
MONO ABS: 0.5 10*3/uL (ref 0.1–1.0)
MONOS PCT: 5 %
NEUTROS ABS: 6.7 10*3/uL (ref 1.7–7.7)
NEUTROS PCT: 75 %
Platelets: 273 10*3/uL (ref 150–400)
RBC: 5.06 MIL/uL (ref 3.87–5.11)
RDW: 14.7 % (ref 11.5–15.5)
WBC: 8.8 10*3/uL (ref 4.0–10.5)

## 2018-04-07 LAB — I-STAT CHEM 8, ED
BUN: 17 mg/dL (ref 8–23)
CHLORIDE: 102 mmol/L (ref 98–111)
Calcium, Ion: 1.18 mmol/L (ref 1.15–1.40)
Creatinine, Ser: 0.9 mg/dL (ref 0.44–1.00)
GLUCOSE: 167 mg/dL — AB (ref 70–99)
HCT: 43 % (ref 36.0–46.0)
Hemoglobin: 14.6 g/dL (ref 12.0–15.0)
POTASSIUM: 3.8 mmol/L (ref 3.5–5.1)
Sodium: 141 mmol/L (ref 135–145)
TCO2: 26 mmol/L (ref 22–32)

## 2018-04-07 LAB — I-STAT TROPONIN, ED: TROPONIN I, POC: 0.36 ng/mL — AB (ref 0.00–0.08)

## 2018-04-07 LAB — TROPONIN I: TROPONIN I: 1.13 ng/mL — AB (ref <0.03)

## 2018-04-07 LAB — LIPASE, BLOOD: Lipase: 37 U/L (ref 11–51)

## 2018-04-07 LAB — D-DIMER, QUANTITATIVE (NOT AT ARMC): D DIMER QUANT: 0.73 ug{FEU}/mL — AB (ref 0.00–0.50)

## 2018-04-07 MED ORDER — IOPAMIDOL (ISOVUE-370) INJECTION 76%
100.0000 mL | Freq: Once | INTRAVENOUS | Status: AC | PRN
  Administered 2018-04-07: 100 mL via INTRAVENOUS

## 2018-04-07 MED ORDER — MORPHINE SULFATE (PF) 2 MG/ML IV SOLN
2.0000 mg | Freq: Once | INTRAVENOUS | Status: AC
  Administered 2018-04-07: 2 mg via INTRAVENOUS
  Filled 2018-04-07: qty 1

## 2018-04-07 MED ORDER — HEPARIN BOLUS VIA INFUSION
4000.0000 [IU] | Freq: Once | INTRAVENOUS | Status: AC
  Administered 2018-04-07: 4000 [IU] via INTRAVENOUS
  Filled 2018-04-07: qty 4000

## 2018-04-07 MED ORDER — ACETAMINOPHEN 650 MG RE SUPP: 650.0000 mg | Freq: Four times a day (QID) | RECTAL | Status: DC | PRN

## 2018-04-07 MED ORDER — ACETAMINOPHEN 325 MG PO TABS: 650.0000 mg | ORAL_TABLET | Freq: Four times a day (QID) | ORAL | Status: DC | PRN

## 2018-04-07 MED ORDER — INSULIN ASPART 100 UNIT/ML ~~LOC~~ SOLN
0.0000 [IU] | Freq: Three times a day (TID) | SUBCUTANEOUS | Status: DC
  Administered 2018-04-09: 1 [IU] via SUBCUTANEOUS

## 2018-04-07 MED ORDER — ONDANSETRON HCL 4 MG/2ML IJ SOLN
4.0000 mg | Freq: Once | INTRAMUSCULAR | Status: AC
  Administered 2018-04-07: 4 mg via INTRAVENOUS
  Filled 2018-04-07: qty 2

## 2018-04-07 MED ORDER — SENNOSIDES-DOCUSATE SODIUM 8.6-50 MG PO TABS: 1.0000 | ORAL_TABLET | Freq: Every evening | ORAL | Status: DC | PRN

## 2018-04-07 MED ORDER — AMLODIPINE BESYLATE 5 MG PO TABS
5.0000 mg | ORAL_TABLET | Freq: Every day | ORAL | Status: DC
Start: 2018-04-08 — End: 2018-04-09
  Administered 2018-04-08 – 2018-04-09 (×2): 5 mg via ORAL
  Filled 2018-04-07 (×2): qty 1

## 2018-04-07 MED ORDER — IOPAMIDOL (ISOVUE-370) INJECTION 76%
INTRAVENOUS | Status: AC
  Filled 2018-04-07: qty 100

## 2018-04-07 MED ORDER — HEPARIN (PORCINE) IN NACL 100-0.45 UNIT/ML-% IJ SOLN
1000.0000 [IU]/h | INTRAMUSCULAR | Status: DC
  Administered 2018-04-07: 1000 [IU]/h via INTRAVENOUS
  Filled 2018-04-07: qty 250

## 2018-04-07 NOTE — ED Notes (Signed)
Family insisting patient have some water and crackers.  Reiterated Dr. Madilyn Hook instructions of prefer pt not to eat and drink until all scans are complete.  Dr. Madilyn Hook did advise pt and family that this is her recommendation.  Gave pt/family crackers and water per their request.

## 2018-04-07 NOTE — H&P (Addendum)
Family Medicine Teaching [facility] Admission History and Physical Service Pager: [phone]  Patient name: Vicki Rogers Medical record number: [id number] Date of birth: September 26, 1945 Age: 72 y.o. Gender: female  Primary Care Provider: Jackie Plum, MD Consultants: cardiology Code Status: FULL (discussed on admission)   Chief Complaint: chest pain, abdominal pain   Assessment and Plan: Vicki Rogers is a 72 y.o. female presenting with chest and abdominal pain. PMH is significant for DM-II, HTN, HLD, GERD, h/o CVA, h/o of diverticulosis.   Chest and abdominal pain - On arrival to ED patient hypertensive to 161/102 with otherwise stable vitals.  Complained of abdominal pain and chest pain this morning around 1130am. The pain did not radiate to her back or down her arm, she did not have diaphoresis, palpitations or vision changes.  She did have vomiting and headache after she was given SL nitroglycerin en route by EMS.  Her troponin level in the ED was 1.13 with no ST elevations noted on EKG. Cardiology saw the patient while in the ED and noted minimally elevated ST segments and believed her symptoms to be more GI related.  CTA did not show dissection or PE. Patient given morphine in the ED which provided some relief in her symptoms.  Do not suspect acute pancreatitis given normal lipase of 37 and patient not c/o nausea/vomiting. Abdominal exam notable for epigastric tenderness without rebound or guarding and negative Murphy's sign. Do not suspect costochondritis as CP not reproducible on exam.  Will admit for further workup and ACS r/o.   - admit to stepdown, attending Dr. Lum Babe  - cardiac monitoring  - heparin drip  -cards will re-evaluate in AM, f/u recs -  NPO at midnight in anticipation of possible AM cath  - AM EKG  - BMP, CBC  - trend troponins - zofran prn for nausea -f/u TSH, A1c, lipid panel  -vitals per unit routine  T2DM - patient takes glipizide 5 mg daily at home. No  previous A1c on file. Cbg in hospital 167.   - repeat A1c  - hold glipizide for now - sSSI -monitor CBGs   HTN - BP on admission 161/102 with some improvement to 140-150 sbp with nitroglycerin.   - continue home amlodipine 5mg   HLD - not on medication at home, she reports she used to be on a statin but stopped around last year - f/u lipid panel   H/o CVA in 1995. Stable.  Residual weakness in her left arm sometimes.   -continue to monitor   H/o diverticulosis.  Last time this bothered her was 60.    GERD. Stable. At home on omeprazole.   -continue to monitor   FEN/GI: heart healthy, NPO at [place] Prophylaxis: heparin infusion  Disposition: Admit to stepdown, attending Dr. Lum Babe   History of Present Illness:  Vicki Rogers is a 72 y.o. female presenting with CP and abdominal pain that began around 1130 AM this morning.  She was trying to make her breakfast when this occurred.  Abdominal pain localized to RUQ below her breast.  Pain 10/10, dull/achy in nature and does not radiate. No diaphoresis, dizziness or vision changes.  Called her grandson and came to ED via EMS, received nitro had some nausea and vomiting in the ED.  Has not eaten anything since this morning.  No diarrhea, constipation or blood in stool. No fevers, chills.  Has never had this type of abdominal or chest pain before.  She feels her abdominal pain may be from not eating  anything.  No recent illnesses.  She took glipizide and amlodipine when pain began but no other medications.  Currently pain is better controlled and is about 3/10.  No urinary symptoms. No history of abdominal surgery.   Review Of Systems: Per HPI with the following additions:  Review of Systems  Constitutional: Negative for chills and fever.  HENT: Negative for congestion.   Eyes: Negative for blurred vision.  Respiratory: Negative for cough and shortness of breath.   Cardiovascular: Positive for chest pain. Negative for palpitations and leg  swelling.  Gastrointestinal: Positive for abdominal pain. Negative for blood in stool, constipation, diarrhea and heartburn.  Neurological: Negative for dizziness.   There are no active problems to display for this patient.   Past Medical History: Past Medical History:  Diagnosis Date  . Arthritis   . Diabetes mellitus without complication (HCC)   . Diverticulosis   . Hypertension   . Stroke [facility])    Past Surgical History: Past Surgical History:  Procedure Laterality Date  . ABDOMINAL SURGERY     Social History: Social History   Tobacco Use  . Smoking status: Never Smoker  . Smokeless tobacco: Never Used  Substance Use Topics  . Alcohol use: No  . Drug use: No   Additional social history: Lives at home by herself.  No history of tobacco, alcohol or illicit drug use.   Please also refer to relevant sections of EMR.  Family History: Family History  Problem Relation Age of Onset  . Diabetes Mother    Allergies and Medications: Allergies  Allergen Reactions  . Penicillins Anaphylaxis    Has patient had a PCN reaction causing immediate rash, facial/tongue/throat swelling, SOB or lightheadedness with hypotension: YES Has patient had a PCN reaction causing severe rash involving mucus membranes or skin necrosis:NO Has patient had a PCN reaction that required hospitalization YES Has patient had a PCN reaction occurring within the last 10 years: NO If all of the above answers are "NO", then may proceed with Cephalosporin use.  . Shrimp [Shellfish Allergy] Anaphylaxis   No current facility-administered medications on file prior to encounter.    Current Outpatient Medications on File Prior to Encounter  Medication Sig Dispense Refill  . amLODipine (NORVASC) 5 MG tablet Take 5 mg by mouth daily.    . carboxymethylcellul-glycerin (OPTIVE) 0.5-0.9 % ophthalmic solution Place 1 drop into both eyes as needed for dry eyes.    Marland Kitchen glipiZIDE (GLUCOTROL XL) 5 MG 24 hr tablet Take 5  mg by mouth daily with breakfast.    . OMEPRAZOLE PO Take 1 capsule by mouth daily as needed (heartburn).     Objective: BP (!) 147/84 (BP Location: Left Arm)   Pulse 70   Temp 98.8 F (37.1 C) (Oral)   Resp 15   Ht 5\' 5"  (1.651 m)   Wt 94.5 kg   SpO2 95%   BMI 34.68 kg/m  Exam: General: patient is alert and oriented.  Laying in bed.  NAD Eyes: PERRL, EOMI  ENTM: MMM.  No oropharyngeal swelling or erythema.  Neck: supple, no lymphadenopathy Cardiovascular: irregular rhythm. Normal rate.  Systolic murmur heard. Grade 3/6.  CP not reproducible on exam with palpation over chest wall.  Respiratory: CTAB, normal effort on 2L per [place]  Gastrointestinal: soft, mildly tender to palpation in RUQ.  No rebound or guarding present. Negative Murphy's. Normal bowel sounds.  MSK: 5/5 strength in lower extremities.  Derm: warm, dry, no rashes. Not diaphoretic.  Psych: pleasant affect.  Cooperative with exam.   Labs and Imaging: CBC BMET  Recent Labs  Lab 04/08/18 0416  WBC 13.8*  HGB 12.8  HCT 39.5  PLT 329   Recent Labs  Lab 04/08/18 0416  NA 140  K 3.4*  CL 105  CO2 24  BUN 18  CREATININE 1.10*  GLUCOSE 51*  CALCIUM 9.7     Frederic Jericho, MD  PGY-1, [facility] Family Medicine FPTS Intern pager: [phone], text pages welcome  I have seen and evaluated the above patient with Dr. Constance Goltz and agree with his documentation.  I am including my edits in blue.   Freddrick March MD [facility] PGY3

## 2018-04-07 NOTE — Progress Notes (Signed)
ANTICOAGULATION CONSULT NOTE - Initial Consult  Pharmacy Consult for Heparin Indication: chest pain/ACS  Allergies  Allergen Reactions  . Penicillins Anaphylaxis    Has patient had a PCN reaction causing immediate rash, facial/tongue/throat swelling, SOB or lightheadedness with hypotension: YES Has patient had a PCN reaction causing severe rash involving mucus membranes or skin necrosis:NO Has patient had a PCN reaction that required hospitalization YES Has patient had a PCN reaction occurring within the last 10 years: NO If all of the above answers are "NO", then may proceed with Cephalosporin use.  . Shrimp [Shellfish Allergy] Anaphylaxis    Patient Measurements: Height: 5\' 5"  (165.1 cm) Weight: 242 lb (109.8 kg) IBW/kg (Calculated) : 57 Heparin Dosing Weight: 82.8 kg  Vital Signs: Temp: 98.5 F (36.9 C) (09/29 1511) BP: 142/83 (09/29 1945) Pulse Rate: 86 (09/29 1945)  Labs: Recent Labs    04/07/18 1610 04/07/18 1623 04/07/18 1900  HGB 13.7 14.6  --   HCT 42.0 43.0  --   PLT 273  --   --   CREATININE 0.93 0.90  --   TROPONINI  --   --  1.13*    Estimated Creatinine Clearance: 70.7 mL/min (by C-G formula based on SCr of 0.9 mg/dL).   Medical History: Past Medical History:  Diagnosis Date  . Arthritis   . Diabetes mellitus without complication (HCC)   . Diverticulosis   . Hypertension   . Stroke [facility])     Assessment: 62 YOF who presented on 9/29 with CP. Cards consulted and per their initial evaluation appeared to be more GI related and advised against anticoagulation. Troponins have continued to trend up and now ED-MD is requesting to start Heparin for anticoagulation while awaiting cardiology re-evaluation in the AM.  The patient was not on anticoagulation PTA. Hx CVA noted - but not w/in the past 3 months. Baseline CBC wnl.   Goal of Therapy:  Heparin level 0.3-0.7 units/ml Monitor platelets by anticoagulation protocol: Yes   Plan:  - Heparin 4000  unit bolus - Start Heparin at a rate of 1000 units/hr - Daily HL, CBC - Will continue to monitor for any signs/symptoms of bleeding and will follow up with heparin level in 6 hours   Thank you for allowing pharmacy to be a part of this patient's care.  Georgina Pillion, PharmD, BCPS Clinical Pharmacist Pager: [phone] Please check AMION for all [facility] Pharmacy numbers 04/07/2018 9:27 PM

## 2018-04-07 NOTE — ED Notes (Signed)
Lab called to see if add on possible for D-Dimer. Lab to run D-Dimer on tube in lab. Will add-on.

## 2018-04-07 NOTE — ED Provider Notes (Signed)
[facility] EMERGENCY DEPARTMENT Provider Note   CSN: [id number] Arrival date & time: 04/07/18  1508     History   Chief Complaint Chief Complaint  Patient presents with  . Chest Pain    HPI Vicki Rogers is a 72 y.o. female.  The history is provided by the patient. No language interpreter was used.  Chest Pain     Vicki Rogers is a 72 y.o. female who presents to the Emergency Department complaining of chest pain. He presents to the emergency department by EMS for evaluation of central chest pain that began about 1130 today. Pain is waxing and waning with episodes lasting about five minutes. She has associated epigastric pain. Pain is described as aching in nature. She has a one episode of nausea and vomiting. She denies any fevers, cough, diarrhea, leg swelling or pain. No prior similar symptoms. She has hypertension, hyperlipidemia as well as diabetes.  Sxs are severe in nature.   Past Medical History:  Diagnosis Date  . Arthritis   . Diabetes mellitus without complication (HCC)   . Diverticulosis   . Hypertension   . Stroke [facility])     There are no active problems to display for this patient.   Past Surgical History:  Procedure Laterality Date  . ABDOMINAL SURGERY       OB History   None      Home Medications    Prior to Admission medications   Medication Sig Start Date End Date Taking? Authorizing Provider  amLODipine (NORVASC) 5 MG tablet Take 5 mg by mouth daily.   Yes [provider]  carboxymethylcellul-glycerin (OPTIVE) 0.5-0.9 % ophthalmic solution Place 1 drop into both eyes as needed for dry eyes.   Yes [provider]  glipiZIDE (GLUCOTROL XL) 5 MG 24 hr tablet Take 5 mg by mouth daily with breakfast.   Yes [provider]  OMEPRAZOLE PO Take 1 capsule by mouth daily as needed (heartburn).   Yes [provider]    Family History Family History  Problem Relation Age of Onset  . Diabetes Mother      Social History Social History   Tobacco Use  . Smoking status: Never Smoker  . Smokeless tobacco: Never Used  Substance Use Topics  . Alcohol use: No  . Drug use: No     Allergies   Penicillins and Shrimp [shellfish allergy]   Review of Systems Review of Systems  Cardiovascular: Positive for chest pain.  All other systems reviewed and are negative.    Physical Exam Updated Vital Signs BP 127/75   Pulse 70   Temp 98.5 F (36.9 C)   Resp 17   Ht 5\' 5"  (1.651 m)   Wt 109.8 kg   SpO2 96%   BMI 40.27 kg/m   Physical Exam  Constitutional: She is oriented to person, place, and time. She appears well-developed and well-nourished.  HENT:  Head: Normocephalic and atraumatic.  Cardiovascular: Normal rate and regular rhythm.  SEM  Pulmonary/Chest: Effort normal and breath sounds normal. No respiratory distress.  Abdominal: Soft. There is no rebound and no guarding.  Mild epigastric tenderness  Musculoskeletal: She exhibits no edema or tenderness.  2+ DP and radial pulses bilaterally  Neurological: She is alert and oriented to person, place, and time.  Skin: Skin is warm and dry.  Psychiatric: She has a normal mood and affect. Her behavior is normal.  Nursing note and vitals reviewed.    ED Treatments / Results  Labs (all labs ordered are listed, but only abnormal results are displayed) Labs Reviewed  COMPREHENSIVE METABOLIC PANEL - Abnormal; Notable for the following components:      Result Value   Glucose, Bld 165 (*)    All other components within normal limits  D-DIMER, QUANTITATIVE (NOT AT [facility]) - Abnormal; Notable for the following components:   D-Dimer, Quant 0.73 (*)    All other components within normal limits  TROPONIN I - Abnormal; Notable for the following components:   Troponin I 1.13 (*)    All other components within normal limits  I-STAT CHEM 8, ED - Abnormal; Notable for the following components:   Glucose, Bld 167 (*)    All other  components within normal limits  I-STAT TROPONIN, ED - Abnormal; Notable for the following components:   Troponin i, poc 0.36 (*)    All other components within normal limits  LIPASE, BLOOD  CBC WITH DIFFERENTIAL/PLATELET  HEPARIN LEVEL (UNFRACTIONATED)  CBC    EKG EKG Interpretation  Date/Time:  Sunday April 07 2018 21:10:28 EDT Ventricular Rate:  78 PR Interval:    QRS Duration: 88 QT Interval:  384 QTC Calculation: 438 R Axis:   51 Text Interpretation:  Sinus rhythm Borderline T wave abnormalities Confirmed by Tilden Fossa [phone]) on 04/07/2018 9:17:09 PM   Radiology Dg Chest Port 1 View  Result Date: 04/07/2018 CLINICAL DATA:  Chest pain EXAM: PORTABLE CHEST 1 VIEW COMPARISON:  December 28, 2017 FINDINGS: There is mild interstitial thickening, stable. There is no frank edema or consolidation. Heart is borderline enlarged with pulmonary vascularity normal. No adenopathy. There is aortic atherosclerosis. There is bilateral carotid artery calcification. No bone lesions. IMPRESSION: Mild interstitial thickening without frank edema or consolidation. Mild cardiac enlargement. No adenopathy evident. There is aortic atherosclerosis as well as foci of calcification in both carotid arteries. Aortic Atherosclerosis (ICD10-I70.0). Electronically Signed   By: Bretta Bang III M.D.   On: 04/07/2018 15:47   Ct Angio Chest/abd/pel For Dissection W And/or W/wo  Result Date: 04/07/2018 CLINICAL DATA:  Chest and abdominal pain. EXAM: CT ANGIOGRAPHY CHEST, ABDOMEN AND PELVIS TECHNIQUE: Multidetector CT imaging through the chest, abdomen and pelvis was performed using the standard protocol during bolus administration of intravenous contrast. Multiplanar reconstructed images and MIPs were obtained and reviewed to evaluate the vascular anatomy. CONTRAST:  ISOVUE-370 IOPAMIDOL (ISOVUE-370) INJECTION 76% COMPARISON:  Radiograph of same day. FINDINGS: CTA CHEST FINDINGS Cardiovascular:  Preferential opacification of the thoracic aorta. No evidence of thoracic aortic aneurysm or dissection. Normal heart size. No pericardial effusion. Mediastinum/Nodes: No enlarged mediastinal, hilar, or axillary lymph nodes. Thyroid gland, trachea, and esophagus demonstrate no significant findings. Lungs/Pleura: Lungs are clear. No pleural effusion or pneumothorax. Musculoskeletal: Moderate thoracic kyphosis is noted. No acute abnormality is noted. Review of the MIP images confirms the above findings. CTA ABDOMEN AND PELVIS FINDINGS VASCULAR Aorta: Atherosclerosis of abdominal aorta is noted without aneurysm formation. 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 Celiac: Patent without evidence of aneurysm, dissection, vasculitis or significant stenosis. SMA: Patent without evidence of aneurysm, dissection, vasculitis or significant stenosis. Renals: Both renal arteries are patent without evidence of aneurysm, dissection, vasculitis, fibromuscular dysplasia or significant stenosis. IMA: Patent without evidence of aneurysm, dissection, vasculitis or significant stenosis. 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 No significant stenosis or other abnormality seen involving the iliac arteries. Veins: No obvious venous abnormality within the limitations of this arterial  phase study. Review of the MIP images confirms the above findings. NON-VASCULAR Hepatobiliary: No focal liver abnormality is seen. No gallstones, gallbladder wall thickening, or biliary dilatation. Pancreas: Unremarkable. No pancreatic ductal dilatation or surrounding inflammatory changes. Spleen: Normal in size without focal abnormality. Adrenals/Urinary Tract: Adrenal glands are unremarkable. Kidneys are normal, without renal calculi, focal lesion, or hydronephrosis. Bladder is unremarkable. Stomach/Bowel: Stomach is within normal limits. Appendix appears normal. No evidence  of bowel wall thickening, distention, or inflammatory changes. Lymphatic: No significant adenopathy is noted. Reproductive: Uterus and bilateral adnexa are unremarkable. Other: No abdominal wall hernia or abnormality. No abdominopelvic ascites. Musculoskeletal: No acute or significant osseous findings. Review of the MIP images confirms the above findings. IMPRESSION: No evidence of thoracic or abdominal aortic aneurysm or dissection. 16 x 14 mm penetrating atherosclerotic ulcer is seen arising anteriorly from infrarenal abdominal aorta. 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 No evidence of significant mesenteric or renal artery stenosis. Aortic Atherosclerosis (ICD10-I70.0). Electronically Signed   By: Lupita Raider, M.D.   On: 04/07/2018 21:32    Procedures Procedures (including critical care time) CRITICAL CARE Performed by: Tilden Fossa   Total critical care time: 50 minutes  Critical care time was exclusive of separately billable procedures and treating other patients.  Critical care was necessary to treat or prevent imminent or life-threatening deterioration.  Critical care was time spent personally by me on the following activities: development of treatment plan with patient and/or surrogate as well as nursing, discussions with consultants, evaluation of patient's response to treatment, examination of patient, obtaining history from patient or surrogate, ordering and performing treatments and interventions, ordering and review of laboratory studies, ordering and review of radiographic studies, pulse oximetry and re-evaluation of patient's condition.  Medications Ordered in ED Medications  iopamidol (ISOVUE-370) 76 % injection (has no administration in time range)  heparin bolus via infusion 4,000 Units (has no administration in time range)  heparin ADULT infusion 100 units/mL (25000 units/293mL sodium chloride 0.45%) (has no administration in time range)  morphine 2 MG/ML  injection 2 mg (2 mg Intravenous Given 04/07/18 1559)  ondansetron (ZOFRAN) injection 4 mg (4 mg Intravenous Given 04/07/18 1749)  morphine 2 MG/ML injection 2 mg (2 mg Intravenous Given 04/07/18 1749)  morphine 2 MG/ML injection 2 mg (2 mg Intravenous Given 04/07/18 1951)  iopamidol (ISOVUE-370) 76 % injection 100 mL (100 mLs Intravenous Contrast Given 04/07/18 2034)     Initial Impression / Assessment and Plan / ED Course  I have reviewed the triage vital signs and the nursing notes.  Pertinent labs & imaging results that were available during my care of the patient were reviewed by me and considered in my medical decision making (see chart for details).     Patient here for evaluation of chest pain and epigastric abdominal pain that began about 1130 today. Pain is waxing and waning in nature. Patient is uncomfortable appearing on ED arrival. EKG with minimal ST elevations without significant reciprocal changes. Initial troponin is elevated at 0.36. Cardiology consulted. Given her ongoing abdominal pain a D dimer was ordered and it was elevated. CTA dissection protocol was obtained. CTA is negative for dissection but does demonstrate an ulcer. Repeat troponin is increased compared to initial troponin. Discussed with cardiology, repeat EKG without evolving changes. Plan to admit to medicine. Medicine consulted for admission. She received aspirin prior to ED arrival. She was started on heparin drip. She was treated with morphine for pain in the emergency  department. She declined any nitroglycerin for her chest pain in the ED. Patient and family updated of findings of studies and recommendation for admission and they are in agreement with treatment plan.  Final Clinical Impressions(s) / ED Diagnoses   Final diagnoses:  None    ED Discharge Orders    None       Tilden Fossa, MD 04/07/18 2226

## 2018-04-07 NOTE — H&P (View-Only) (Signed)
Cardiology Admission History and Physical:   Patient ID: Vicki Rogers MRN: [id number]; DOB: 05/20/1946   Admission date: 04/07/2018  Primary Care Provider: Osei-Bonsu, George, MD Primary Cardiologist: No primary care provider on file.   Chief Complaint: Abdominal pain, elevated troponins  Patient Profile:   Vicki Rogers is a 72 y.o. female with past medical history of diabetes mellitus type 2, diverticulosis, hypertension and stroke.  History of Present Illness:   Vicki Rogers has a past medical history of diabetes mellitus type 2, diverticulosis, hypertension and stroke.  She has had no prior history of cardiac events or treatment.  She presented to the hospital today with complaints of chest pain that radiated to her right jaw beginning at 12:00 today.  In the EMS the patient developed nausea and vomiting.  Apparently she was hyperglycemic and given 400 mL of normal saline.  She was given 1 sublingual nitroglycerin and 324 mg of aspirin.  Her first troponin is 0.36.  Her other labs are unremarkable.  Glucose is 167, serum creatinine 0.90.  Chest x-ray shows mild interstitial thickening without frank edema or consolidation.  Mild cardiac enlargement.  Aortic atherosclerosis is noted as well as foci of calcification in both carotid arteries.  EKG is essentially normal with very minimal ST elevations in several leads.  Upon my evaluation the patient is very uncomfortable with upper abdominal pain.  She says that her pain started about 1130 or 12 today while she was trying to make breakfast.  She had just eaten some bread and tea.  She started feeling weak and she drank some orange juice thinking her blood sugar might be low.  When she checked her blood sugar it was 172 and her blood pressure was 160.  Her pain has continued to wax and wane.  She is unable to tell me precisely how long it lasts when it comes.  She says that she vomited once right after she arrived in the ED.  She does not smoke or  drink alcohol.  She denies any significant family history of cardiac disease.   Past Medical History:  Diagnosis Date  . Arthritis   . Diabetes mellitus without complication (HCC)   . Diverticulosis   . Hypertension   . Stroke (HCC)     Past Surgical History:  Procedure Laterality Date  . ABDOMINAL SURGERY       Medications Prior to Admission: Prior to Admission medications   Medication Sig Start Date End Date Taking? Authorizing Provider  amLODipine (NORVASC) 5 MG tablet Take 5 mg by mouth daily.    [provider]  azithromycin (ZITHROMAX) 250 MG tablet Take 1 tablet (250 mg total) by mouth daily. Take first 2 tablets together, then 1 every day until finished. Patient not taking: Reported on 12/28/2017 09/09/15   West, Emily, PA-C  carboxymethylcellul-glycerin (OPTIVE) 0.5-0.9 % ophthalmic solution Place 1 drop into both eyes as needed for dry eyes.    [provider]  glipiZIDE (GLUCOTROL XL) 5 MG 24 hr tablet Take 5 mg by mouth daily with breakfast.    [provider]  oxyCODONE (OXY IR/ROXICODONE) 5 MG immediate release tablet Take 5 mg by mouth daily as needed for pain. 11/20/17   [provider]     Allergies:    Allergies  Allergen Reactions  . Penicillins Anaphylaxis    Has patient had a PCN reaction causing immediate rash, facial/tongue/throat swelling, SOB or lightheadedness with hypotension: YES Has patient had a PCN reaction causing severe rash involving   mucus membranes or skin necrosis:NO Has patient had a PCN reaction that required hospitalization YES Has patient had a PCN reaction occurring within the last 10 years: NO If all of the above answers are "NO", then may proceed with Cephalosporin use.  . Shrimp [Shellfish Allergy] Anaphylaxis    Social History:   Social History   Socioeconomic History  . Marital status: Widowed    Spouse name: Not on file  . Number of children: Not on file  . Years of education: Not on file  .  Highest education level: Not on file  Occupational History  . Not on file  Social Needs  . Financial resource strain: Not on file  . Food insecurity:    Worry: Not on file    Inability: Not on file  . Transportation needs:    Medical: Not on file    Non-medical: Not on file  Tobacco Use  . Smoking status: Never Smoker  . Smokeless tobacco: Never Used  Substance and Sexual Activity  . Alcohol use: No  . Drug use: No  . Sexual activity: Not on file  Lifestyle  . Physical activity:    Days per week: Not on file    Minutes per session: Not on file  . Stress: Not on file  Relationships  . Social connections:    Talks on phone: Not on file    Gets together: Not on file    Attends religious service: Not on file    Active member of club or organization: Not on file    Attends meetings of clubs or organizations: Not on file    Relationship status: Not on file  . Intimate partner violence:    Fear of current or ex partner: Not on file    Emotionally abused: Not on file    Physically abused: Not on file    Forced sexual activity: Not on file  Other Topics Concern  . Not on file  Social History Narrative  . Not on file    Family History:   The patient's family history includes Diabetes in her mother.    ROS:  Please see the history of present illness.  All other ROS reviewed and negative.     Physical Exam/Data:   Vitals:   04/07/18 1508 04/07/18 1511 04/07/18 1515  BP:  (!) 161/102   Pulse:  96   Resp:  18   Temp:  98.5 F (36.9 C)   SpO2: 99% 99%   Weight:   109.8 kg  Height:   5' 5" (1.651 m)   No intake or output data in the 24 hours ending 04/07/18 1703 Filed Weights   04/07/18 1515  Weight: 109.8 kg   Body mass index is 40.27 kg/m.  General:  Well nourished, well developed, in no acute distress HEENT: normal Lymph: no adenopathy Neck: no JVD Endocrine:  No thryomegaly Vascular: No carotid bruits; FA pulses 2+ bilaterally without bruits  Cardiac:   normal S1, S2; RRR; 3/6 harsh systolic murmur best heard in the left chest Lungs:  clear to auscultation bilaterally, no wheezing, rhonchi or rales  Abd: soft, nontender, no hepatomegaly  Ext: no edema Musculoskeletal:  No deformities, BUE and BLE strength normal and equal Skin: warm and dry  Neuro:  CNs 2-12 intact, no focal abnormalities noted Psych:  Normal affect    EKG:  The ECG that was done was personally reviewed and demonstrates sinus rhythm with very minimally elevated ST segments in leads II, 3,   AVS, V1- 4   Relevant CV Studies:  No prior studies available  Laboratory Data:  Chemistry Recent Labs  Lab 04/07/18 1623  NA 141  K 3.8  CL 102  GLUCOSE 167*  BUN 17  CREATININE 0.90    No results for input(s): PROT, ALBUMIN, AST, ALT, ALKPHOS, BILITOT in the last 168 hours. Hematology Recent Labs  Lab 04/07/18 1610 04/07/18 1623  WBC 8.8  --   RBC 5.06  --   HGB 13.7 14.6  HCT 42.0 43.0  MCV 83.0  --   MCH 27.1  --   MCHC 32.6  --   RDW 14.7  --   PLT 273  --    Cardiac EnzymesNo results for input(s): TROPONINI in the last 168 hours.  Recent Labs  Lab 04/07/18 1622  TROPIPOC 0.36*    BNPNo results for input(s): BNP, PROBNP in the last 168 hours.  DDimer No results for input(s): DDIMER in the last 168 hours.  Radiology/Studies:  Dg Chest Port 1 View  Result Date: 04/07/2018 CLINICAL DATA:  Chest pain EXAM: PORTABLE CHEST 1 VIEW COMPARISON:  December 28, 2017 FINDINGS: There is mild interstitial thickening, stable. There is no frank edema or consolidation. Heart is borderline enlarged with pulmonary vascularity normal. No adenopathy. There is aortic atherosclerosis. There is bilateral carotid artery calcification. No bone lesions. IMPRESSION: Mild interstitial thickening without frank edema or consolidation. Mild cardiac enlargement. No adenopathy evident. There is aortic atherosclerosis as well as foci of calcification in both carotid arteries. Aortic  Atherosclerosis (ICD10-I70.0). Electronically Signed   By: William  Woodruff III M.D.   On: 04/07/2018 15:47    Assessment and Plan:   Elevated troponin -Patient presents with pain across her upper abdomen, waxing and waning.  Associated vomiting x1. -EKG with very minimally elevated ST segments in leads II, 3, AVF, V1- 4 -CVD risk factors include hypertension, diabetes.  -No lipid levels noted in epic -Presenting symptoms appear to be more GI related.  Advised for medical admission for evaluation of possible GI cause.  Continue to monitor troponins.   Hypertension -Home medications include amlodipine 5 mg daily -Blood pressure is elevated, possibly related to pain  Diabetes type 2 -No hemoglobin A1c available in epic   For questions or updates, please contact [facility] Please consult [email or web address] for contact info under        Signed, Janine Hammond, NP  04/07/2018 5:03 PM    I have seen, examined the patient, and reviewed the above assessment and plan.  Changes to above are made where necessary.  On exam, RRR.  She has epigastric tenderness which reproduces her symptoms.  Would advise IM consultation for admission.  Cardiology to reassess in am.  Given concerns for bleeding, I would not advise anticoagulation at this time.  Co Sign: Jayston Trevino, MD 04/07/2018 5:08 PM   

## 2018-04-07 NOTE — ED Triage Notes (Signed)
Pt arrives to ED from home with complaints of chest pain radiating to her right jaw since 1200 today. EMS reports pt developed n/v en route, was hyperglycemic and given NS. Pt given 1 nitro and 324 asa. Cp subsided after nitro, and pt began complaining of abdominal pain. Pt placed in position of comfort with bed locked and lowered, call bell in reach.

## 2018-04-07 NOTE — Consult Note (Addendum)
Cardiology Admission History and Physical:   Patient ID: Vicki Rogers MRN: [id number]; DOB: 1945-12-09   Admission date: 04/07/2018  Primary Care Provider: Jackie Plum, MD Primary Cardiologist: No primary care provider on file.   Chief Complaint: Abdominal pain, elevated troponins  Patient Profile:   Vicki Rogers is a 72 y.o. female with past medical history of diabetes mellitus type 2, diverticulosis, hypertension and stroke.  History of Present Illness:   [name] has a past medical history of diabetes mellitus type 2, diverticulosis, hypertension and stroke.  She has had no prior history of cardiac events or treatment.  She presented to the hospital today with complaints of chest pain that radiated to her right jaw beginning at 12:00 today.  In the EMS the patient developed nausea and vomiting.  Apparently she was hyperglycemic and given 400 mL of normal saline.  She was given 1 sublingual nitroglycerin and 324 mg of aspirin.  Her first troponin is 0.36.  Her other labs are unremarkable.  Glucose is 167, serum creatinine 0.90.  Chest x-ray shows mild interstitial thickening without frank edema or consolidation.  Mild cardiac enlargement.  Aortic atherosclerosis is noted as well as foci of calcification in both carotid arteries.  EKG is essentially normal with very minimal ST elevations in several leads.  Upon my evaluation the patient is very uncomfortable with upper abdominal pain.  She says that her pain started about 1130 or 12 today while she was trying to make breakfast.  She had just eaten some bread and tea.  She started feeling weak and she drank some orange juice thinking her blood sugar might be low.  When she checked her blood sugar it was 172 and her blood pressure was 160.  Her pain has continued to wax and wane.  She is unable to tell me precisely how long it lasts when it comes.  She says that she vomited once right after she arrived in the ED.  She does not smoke or  drink alcohol.  She denies any significant family history of cardiac disease.   Past Medical History:  Diagnosis Date  . Arthritis   . Diabetes mellitus without complication (HCC)   . Diverticulosis   . Hypertension   . Stroke [facility])     Past Surgical History:  Procedure Laterality Date  . ABDOMINAL SURGERY       Medications Prior to Admission: Prior to Admission medications   Medication Sig Start Date End Date Taking? Authorizing Provider  amLODipine (NORVASC) 5 MG tablet Take 5 mg by mouth daily.    [provider]  azithromycin (ZITHROMAX) 250 MG tablet Take 1 tablet (250 mg total) by mouth daily. Take first 2 tablets together, then 1 every day until finished. Patient not taking: Reported on 12/28/2017 09/09/15   Trixie Dredge, PA-C  carboxymethylcellul-glycerin (OPTIVE) 0.5-0.9 % ophthalmic solution Place 1 drop into both eyes as needed for dry eyes.    [provider]  glipiZIDE (GLUCOTROL XL) 5 MG 24 hr tablet Take 5 mg by mouth daily with breakfast.    [provider]  oxyCODONE (OXY IR/ROXICODONE) 5 MG immediate release tablet Take 5 mg by mouth daily as needed for pain. 11/20/17   [provider]     Allergies:    Allergies  Allergen Reactions  . Penicillins Anaphylaxis    Has patient had a PCN reaction causing immediate rash, facial/tongue/throat swelling, SOB or lightheadedness with hypotension: YES Has patient had a PCN reaction causing severe rash involving  mucus membranes or skin necrosis:NO Has patient had a PCN reaction that required hospitalization YES Has patient had a PCN reaction occurring within the last 10 years: NO If all of the above answers are "NO", then may proceed with Cephalosporin use.  Marland Kitchen Shrimp [Shellfish Allergy] Anaphylaxis    Social History:   Social History   Socioeconomic History  . Marital status: Widowed    Spouse name: Not on file  . Number of children: Not on file  . Years of education: Not on file  .  Highest education level: Not on file  Occupational History  . Not on file  Social Needs  . Financial resource strain: Not on file  . Food insecurity:    Worry: Not on file    Inability: Not on file  . Transportation needs:    Medical: Not on file    Non-medical: Not on file  Tobacco Use  . Smoking status: Never Smoker  . Smokeless tobacco: Never Used  Substance and Sexual Activity  . Alcohol use: No  . Drug use: No  . Sexual activity: Not on file  Lifestyle  . Physical activity:    Days per week: Not on file    Minutes per session: Not on file  . Stress: Not on file  Relationships  . Social connections:    Talks on phone: Not on file    Gets together: Not on file    Attends religious service: Not on file    Active member of club or organization: Not on file    Attends meetings of clubs or organizations: Not on file    Relationship status: Not on file  . Intimate partner violence:    Fear of current or ex partner: Not on file    Emotionally abused: Not on file    Physically abused: Not on file    Forced sexual activity: Not on file  Other Topics Concern  . Not on file  Social History Narrative  . Not on file    Family History:   The patient's family history includes Diabetes in her mother.    ROS:  Please see the history of present illness.  All other ROS reviewed and negative.     Physical Exam/Data:   Vitals:   04/07/18 1508 04/07/18 1511 04/07/18 1515  BP:  (!) 161/102   Pulse:  96   Resp:  18   Temp:  98.5 F (36.9 C)   SpO2: 99% 99%   Weight:   109.8 kg  Height:   5\' 5"  (1.651 m)   No intake or output data in the 24 hours ending 04/07/18 1703 Filed Weights   04/07/18 1515  Weight: 109.8 kg   Body mass index is 40.27 kg/m.  General:  Well nourished, well developed, in no acute distress HEENT: normal Lymph: no adenopathy Neck: no JVD Endocrine:  No thryomegaly Vascular: No carotid bruits; FA pulses 2+ bilaterally without bruits  Cardiac:   normal S1, S2; RRR; 3/6 harsh systolic murmur best heard in the left chest Lungs:  clear to auscultation bilaterally, no wheezing, rhonchi or rales  Abd: soft, nontender, no hepatomegaly  Ext: no edema Musculoskeletal:  No deformities, BUE and BLE strength normal and equal Skin: warm and dry  Neuro:  CNs 2-12 intact, no focal abnormalities noted Psych:  Normal affect    EKG:  The ECG that was done was personally reviewed and demonstrates sinus rhythm with very minimally elevated ST segments in leads II, 3,  AVS, V1- 4   Relevant CV Studies:  No prior studies available  Laboratory Data:  Chemistry Recent Labs  Lab 04/07/18 1623  NA 141  K 3.8  CL 102  GLUCOSE 167*  BUN 17  CREATININE 0.90    No results for input(s): PROT, ALBUMIN, AST, ALT, ALKPHOS, BILITOT in the last 168 hours. Hematology Recent Labs  Lab 04/07/18 1610 04/07/18 1623  WBC 8.8  --   RBC 5.06  --   HGB 13.7 14.6  HCT 42.0 43.0  MCV 83.0  --   MCH 27.1  --   MCHC 32.6  --   RDW 14.7  --   PLT 273  --    Cardiac EnzymesNo results for input(s): TROPONINI in the last 168 hours.  Recent Labs  Lab 04/07/18 1622  TROPIPOC 0.36*    BNPNo results for input(s): BNP, PROBNP in the last 168 hours.  DDimer No results for input(s): DDIMER in the last 168 hours.  Radiology/Studies:  Dg Chest Port 1 View  Result Date: 04/07/2018 CLINICAL DATA:  Chest pain EXAM: PORTABLE CHEST 1 VIEW COMPARISON:  December 28, 2017 FINDINGS: There is mild interstitial thickening, stable. There is no frank edema or consolidation. Heart is borderline enlarged with pulmonary vascularity normal. No adenopathy. There is aortic atherosclerosis. There is bilateral carotid artery calcification. No bone lesions. IMPRESSION: Mild interstitial thickening without frank edema or consolidation. Mild cardiac enlargement. No adenopathy evident. There is aortic atherosclerosis as well as foci of calcification in both carotid arteries. Aortic  Atherosclerosis (ICD10-I70.0). Electronically Signed   By: Bretta Bang III M.D.   On: 04/07/2018 15:47    Assessment and Plan:   Elevated troponin -Patient presents with pain across her upper abdomen, waxing and waning.  Associated vomiting x1. -EKG with very minimally elevated ST segments in leads II, 3, AVF, V1- 4 -CVD risk factors include hypertension, diabetes.  -No lipid levels noted in epic -Presenting symptoms appear to be more GI related.  Advised for medical admission for evaluation of possible GI cause.  Continue to monitor troponins.   Hypertension -Home medications include amlodipine 5 mg daily -Blood pressure is elevated, possibly related to pain  Diabetes type 2 -No hemoglobin A1c available in epic   For questions or updates, please contact [facility] Please consult [email or web address] for contact info under        Signed, Berton Bon, NP  04/07/2018 5:03 PM    I have seen, examined the patient, and reviewed the above assessment and plan.  Changes to above are made where necessary.  On exam, RRR.  She has epigastric tenderness which reproduces her symptoms.  Would advise IM consultation for admission.  Cardiology to reassess in am.  Given concerns for bleeding, I would not advise anticoagulation at this time.  Co Sign: Hillis Range, MD 04/07/2018 5:08 PM

## 2018-04-08 ENCOUNTER — Encounter (HOSPITAL_COMMUNITY): Payer: Self-pay | Admitting: Cardiovascular Disease

## 2018-04-08 ENCOUNTER — Encounter (HOSPITAL_COMMUNITY)
Admission: EM | Disposition: A | Discharge status: Home Health | Payer: Self-pay | Source: Home / Self Care | Attending: Family Medicine

## 2018-04-08 DIAGNOSIS — I719 Aortic aneurysm of unspecified site, without rupture: Secondary | ICD-10-CM

## 2018-04-08 DIAGNOSIS — I350 Nonrheumatic aortic (valve) stenosis: Secondary | ICD-10-CM

## 2018-04-08 DIAGNOSIS — E118 Type 2 diabetes mellitus with unspecified complications: Secondary | ICD-10-CM

## 2018-04-08 DIAGNOSIS — I2510 Atherosclerotic heart disease of native coronary artery without angina pectoris: Secondary | ICD-10-CM

## 2018-04-08 DIAGNOSIS — R079 Chest pain, unspecified: Secondary | ICD-10-CM | POA: Diagnosis present

## 2018-04-08 DIAGNOSIS — I214 Non-ST elevation (NSTEMI) myocardial infarction: Principal | ICD-10-CM

## 2018-04-08 DIAGNOSIS — R1013 Epigastric pain: Secondary | ICD-10-CM

## 2018-04-08 DIAGNOSIS — E785 Hyperlipidemia, unspecified: Secondary | ICD-10-CM

## 2018-04-08 DIAGNOSIS — I213 ST elevation (STEMI) myocardial infarction of unspecified site: Secondary | ICD-10-CM

## 2018-04-08 HISTORY — PX: LEFT HEART CATH AND CORONARY ANGIOGRAPHY: CATH118249

## 2018-04-08 HISTORY — PX: CORONARY STENT INTERVENTION: CATH118234

## 2018-04-08 LAB — HEMOGLOBIN A1C
Hgb A1c MFr Bld: 6.4 % — ABNORMAL HIGH (ref 4.8–5.6)
Mean Plasma Glucose: 136.98 mg/dL

## 2018-04-08 LAB — TSH: TSH: 1.943 u[IU]/mL (ref 0.350–4.500)

## 2018-04-08 LAB — HEPARIN LEVEL (UNFRACTIONATED): Heparin Unfractionated: 0.43 IU/mL (ref 0.30–0.70)

## 2018-04-08 LAB — CBC
HCT: 39.5 % (ref 36.0–46.0)
HEMOGLOBIN: 12.8 g/dL (ref 12.0–15.0)
MCH: 27.1 pg (ref 26.0–34.0)
MCHC: 32.4 g/dL (ref 30.0–36.0)
MCV: 83.7 fL (ref 78.0–100.0)
PLATELETS: 329 10*3/uL (ref 150–400)
RBC: 4.72 MIL/uL (ref 3.87–5.11)
RDW: 15.2 % (ref 11.5–15.5)
WBC: 13.8 10*3/uL — ABNORMAL HIGH (ref 4.0–10.5)

## 2018-04-08 LAB — GLUCOSE, CAPILLARY
GLUCOSE-CAPILLARY: 83 mg/dL (ref 70–99)
Glucose-Capillary: 83 mg/dL (ref 70–99)
Glucose-Capillary: 76 mg/dL (ref 70–99)
Glucose-Capillary: 141 mg/dL — ABNORMAL HIGH (ref 70–99)

## 2018-04-08 LAB — BASIC METABOLIC PANEL
Anion gap: 11 (ref 5–15)
BUN: 18 mg/dL (ref 8–23)
CHLORIDE: 105 mmol/L (ref 98–111)
CO2: 24 mmol/L (ref 22–32)
Calcium: 9.7 mg/dL (ref 8.9–10.3)
Creatinine, Ser: 1.1 mg/dL — ABNORMAL HIGH (ref 0.44–1.00)
GFR calc Af Amer: 57 mL/min — ABNORMAL LOW (ref >60)
GFR, EST NON AFRICAN AMERICAN: 49 mL/min — AB (ref >60)
GLUCOSE: 51 mg/dL — AB (ref 70–99)
POTASSIUM: 3.4 mmol/L — AB (ref 3.5–5.1)
Sodium: 140 mmol/L (ref 135–145)

## 2018-04-08 LAB — MRSA PCR SCREENING: MRSA by PCR: NEGATIVE

## 2018-04-08 LAB — LIPID PANEL
Cholesterol: 244 mg/dL — ABNORMAL HIGH (ref 0–200)
HDL: 62 mg/dL (ref >40)
LDL CALC: 171 mg/dL — AB (ref 0–99)
TRIGLYCERIDES: 56 mg/dL (ref <150)
Total CHOL/HDL Ratio: 3.9 RATIO
VLDL: 11 mg/dL (ref 0–40)

## 2018-04-08 LAB — TROPONIN I
TROPONIN I: 6.18 ng/mL — AB (ref <0.03)
Troponin I: 8.79 ng/mL (ref <0.03)
Troponin I: 6.99 ng/mL (ref <0.03)

## 2018-04-08 LAB — POCT ACTIVATED CLOTTING TIME
Activated Clotting Time: 268 seconds
Activated Clotting Time: 274 seconds

## 2018-04-08 SURGERY — LEFT HEART CATH AND CORONARY ANGIOGRAPHY
Anesthesia: LOCAL

## 2018-04-08 MED ORDER — LIDOCAINE HCL (PF) 1 % IJ SOLN
INTRAMUSCULAR | Status: DC | PRN
  Administered 2018-04-08: 2 mL

## 2018-04-08 MED ORDER — SODIUM CHLORIDE 0.9% FLUSH
3.0000 mL | Freq: Two times a day (BID) | INTRAVENOUS | Status: DC
  Administered 2018-04-08 – 2018-04-09 (×2): 3 mL via INTRAVENOUS

## 2018-04-08 MED ORDER — HEPARIN SODIUM (PORCINE) 1000 UNIT/ML IJ SOLN
INTRAMUSCULAR | Status: DC | PRN
  Administered 2018-04-08 (×2): 5000 [IU] via INTRAVENOUS
  Administered 2018-04-08: 2000 [IU] via INTRAVENOUS

## 2018-04-08 MED ORDER — FENTANYL CITRATE (PF) 100 MCG/2ML IJ SOLN
INTRAMUSCULAR | Status: AC
  Filled 2018-04-08: qty 2

## 2018-04-08 MED ORDER — SODIUM CHLORIDE 0.9% FLUSH: 3.0000 mL | INTRAVENOUS | Status: DC | PRN

## 2018-04-08 MED ORDER — TICAGRELOR 90 MG PO TABS
90.0000 mg | ORAL_TABLET | Freq: Two times a day (BID) | ORAL | Status: DC
  Administered 2018-04-08 – 2018-04-09 (×2): 90 mg via ORAL
  Filled 2018-04-08 (×2): qty 1

## 2018-04-08 MED ORDER — DEXTROSE 50 % IV SOLN
INTRAVENOUS | Status: AC
  Filled 2018-04-08: qty 50

## 2018-04-08 MED ORDER — SODIUM CHLORIDE 0.9 % IV SOLN: 250.0000 mL | INTRAVENOUS | Status: DC | PRN

## 2018-04-08 MED ORDER — NITROGLYCERIN 1 MG/10 ML FOR IR/CATH LAB
INTRA_ARTERIAL | Status: DC | PRN
  Administered 2018-04-08: 150 ug via INTRACORONARY

## 2018-04-08 MED ORDER — HYDRALAZINE HCL 20 MG/ML IJ SOLN: 5.0000 mg | INTRAMUSCULAR | Status: AC | PRN

## 2018-04-08 MED ORDER — ASPIRIN 81 MG PO CHEW: 81.0000 mg | CHEWABLE_TABLET | ORAL | Status: DC

## 2018-04-08 MED ORDER — HEPARIN (PORCINE) IN NACL 1000-0.9 UT/500ML-% IV SOLN
INTRAVENOUS | Status: DC | PRN
  Administered 2018-04-08 (×2): 500 mL

## 2018-04-08 MED ORDER — SODIUM CHLORIDE 0.9 % WEIGHT BASED INFUSION
3.0000 mL/kg/h | INTRAVENOUS | Status: DC
  Administered 2018-04-08: 3 mL/kg/h via INTRAVENOUS

## 2018-04-08 MED ORDER — NITROGLYCERIN 1 MG/10 ML FOR IR/CATH LAB
INTRA_ARTERIAL | Status: AC
  Filled 2018-04-08: qty 10

## 2018-04-08 MED ORDER — VERAPAMIL HCL 2.5 MG/ML IV SOLN
INTRAVENOUS | Status: AC
  Filled 2018-04-08: qty 2

## 2018-04-08 MED ORDER — LIDOCAINE HCL (PF) 1 % IJ SOLN
INTRAMUSCULAR | Status: AC
  Filled 2018-04-08: qty 30

## 2018-04-08 MED ORDER — METOPROLOL TARTRATE 25 MG PO TABS
25.0000 mg | ORAL_TABLET | Freq: Two times a day (BID) | ORAL | Status: DC
  Administered 2018-04-08 – 2018-04-09 (×2): 25 mg via ORAL
  Filled 2018-04-08 (×2): qty 1

## 2018-04-08 MED ORDER — TICAGRELOR 90 MG PO TABS
ORAL_TABLET | ORAL | Status: AC
  Filled 2018-04-08: qty 2

## 2018-04-08 MED ORDER — SODIUM CHLORIDE 0.9 % WEIGHT BASED INFUSION: 3.0000 mL/kg/h | INTRAVENOUS | Status: DC

## 2018-04-08 MED ORDER — MIDAZOLAM HCL 2 MG/2ML IJ SOLN
INTRAMUSCULAR | Status: AC
  Filled 2018-04-08: qty 2

## 2018-04-08 MED ORDER — SODIUM CHLORIDE 0.9 % WEIGHT BASED INFUSION: 1.0000 mL/kg/h | INTRAVENOUS | Status: DC

## 2018-04-08 MED ORDER — ATORVASTATIN CALCIUM 80 MG PO TABS
80.0000 mg | ORAL_TABLET | Freq: Every day | ORAL | Status: DC
  Administered 2018-04-08 – 2018-04-09 (×2): 80 mg via ORAL
  Filled 2018-04-08 (×2): qty 1

## 2018-04-08 MED ORDER — VERAPAMIL HCL 2.5 MG/ML IV SOLN
INTRAVENOUS | Status: DC | PRN
  Administered 2018-04-08: 10 mL via INTRA_ARTERIAL

## 2018-04-08 MED ORDER — ASPIRIN EC 81 MG PO TBEC: 81.0000 mg | DELAYED_RELEASE_TABLET | Freq: Every day | ORAL | Status: DC

## 2018-04-08 MED ORDER — OXYCODONE HCL 5 MG PO TABS: 5.0000 mg | ORAL_TABLET | ORAL | Status: DC | PRN

## 2018-04-08 MED ORDER — MORPHINE SULFATE (PF) 2 MG/ML IV SOLN: 2.0000 mg | INTRAVENOUS | Status: DC | PRN

## 2018-04-08 MED ORDER — ASPIRIN 81 MG PO CHEW
81.0000 mg | CHEWABLE_TABLET | ORAL | Status: AC
  Administered 2018-04-08: 81 mg via ORAL
  Filled 2018-04-08: qty 1

## 2018-04-08 MED ORDER — FENTANYL CITRATE (PF) 100 MCG/2ML IJ SOLN
INTRAMUSCULAR | Status: DC | PRN
  Administered 2018-04-08 (×2): 25 ug via INTRAVENOUS
  Administered 2018-04-08: 50 ug via INTRAVENOUS

## 2018-04-08 MED ORDER — ASPIRIN EC 81 MG PO TBEC
81.0000 mg | DELAYED_RELEASE_TABLET | Freq: Every day | ORAL | Status: DC
  Administered 2018-04-09: 81 mg via ORAL
  Filled 2018-04-08: qty 1

## 2018-04-08 MED ORDER — SODIUM CHLORIDE 0.9% FLUSH: 3.0000 mL | Freq: Two times a day (BID) | INTRAVENOUS | Status: DC

## 2018-04-08 MED ORDER — SODIUM CHLORIDE 0.9 % WEIGHT BASED INFUSION
1.0000 mL/kg/h | INTRAVENOUS | Status: AC
  Administered 2018-04-08: 19:00:00 1 mL/kg/h via INTRAVENOUS

## 2018-04-08 MED ORDER — TICAGRELOR 90 MG PO TABS
ORAL_TABLET | ORAL | Status: DC | PRN
  Administered 2018-04-08: 180 mg via ORAL

## 2018-04-08 MED ORDER — MIDAZOLAM HCL 2 MG/2ML IJ SOLN
INTRAMUSCULAR | Status: DC | PRN
  Administered 2018-04-08: 2 mg via INTRAVENOUS
  Administered 2018-04-08 (×2): 1 mg via INTRAVENOUS

## 2018-04-08 MED ORDER — IOHEXOL 350 MG/ML SOLN
INTRAVENOUS | Status: DC | PRN
  Administered 2018-04-08: 160 mL via INTRA_ARTERIAL

## 2018-04-08 MED ORDER — ONDANSETRON HCL 4 MG/2ML IJ SOLN: 4.0000 mg | Freq: Four times a day (QID) | INTRAMUSCULAR | Status: DC | PRN

## 2018-04-08 MED ORDER — HEPARIN (PORCINE) IN NACL 1000-0.9 UT/500ML-% IV SOLN
INTRAVENOUS | Status: AC
  Filled 2018-04-08: qty 1000

## 2018-04-08 MED ORDER — LABETALOL HCL 5 MG/ML IV SOLN: 10.0000 mg | INTRAVENOUS | Status: AC | PRN

## 2018-04-08 SURGICAL SUPPLY — 21 items
BALLN SAPPHIRE 2.0X12 (BALLOONS) ×2
BALLN SAPPHIRE ~~LOC~~ 3.25X15 (BALLOONS) ×2 IMPLANT
BALLOON SAPPHIRE 2.0X12 (BALLOONS) ×1 IMPLANT
CATH 5FR JL3.5 JR4 ANG PIG MP (CATHETERS) ×2 IMPLANT
CATH INFINITI 5 FR AR1 MOD (CATHETERS) ×2 IMPLANT
CATH LAUNCHER 5F EBU3.0 (CATHETERS) ×1 IMPLANT
CATH LAUNCHER 5F RADL (CATHETERS) ×1 IMPLANT
CATHETER LAUNCHER 5F EBU3.0 (CATHETERS) ×2
CATHETER LAUNCHER 5F RADL (CATHETERS) ×2
DEVICE RAD COMP TR BAND LRG (VASCULAR PRODUCTS) ×2 IMPLANT
GLIDESHEATH SLEND SS 6F .021 (SHEATH) ×2 IMPLANT
GUIDEWIRE INQWIRE 1.5J.035X260 (WIRE) ×1 IMPLANT
INQWIRE 1.5J .035X260CM (WIRE) ×2
KIT ENCORE 26 ADVANTAGE (KITS) ×2 IMPLANT
KIT ESSENTIALS PG (KITS) ×2 IMPLANT
KIT HEART LEFT (KITS) ×2 IMPLANT
PACK CARDIAC CATHETERIZATION (CUSTOM PROCEDURE TRAY) ×2 IMPLANT
STENT ORSIRO 3.0X22 (Permanent Stent) ×2 IMPLANT
TRANSDUCER W/STOPCOCK (MISCELLANEOUS) ×2 IMPLANT
TUBING CIL FLEX 10 FLL-RA (TUBING) ×2 IMPLANT
WIRE COUGAR XT STRL 190CM (WIRE) ×2 IMPLANT

## 2018-04-08 NOTE — Progress Notes (Signed)
TR BAND REMOVAL  LOCATION:    right radial  DEFLATED PER PROTOCOL:    Yes.    TIME BAND OFF / DRESSING APPLIED:    1545   SITE UPON ARRIVAL:    Level 0  SITE AFTER BAND REMOVAL:    Level 0  CIRCULATION SENSATION AND MOVEMENT:    Within Normal Limits   Yes.    COMMENTS:   Tolerated procedure well 

## 2018-04-08 NOTE — Care Management Note (Addendum)
Case Management Note  Patient Details  Name: Vicki Rogers MRN: [id number] Date of Birth: 08/19/1945  Subjective/Objective:  Presents with CP/ epigastric pain, NSTEMI. Hx of diabetes mellitus type 2, diverticulosis, hypertension and stroke.  Resides alone. DME: walker.        Lajean Silvius (Daughter) Yuvia Plant ([phone]      PCP: Dr. Karle Barr- Bonsu  Action/Plan: Transition to home when medically ready with home health service to follow. States has transportation to home once d/c.  [facility]/ [address],called by NCM to confirm Brilinta in stock. Pharmacy assistant @ [facility] confirmed they do have medication in stock. Brilinta coupon card given to pt with explanation per NCM.  Expected Discharge Date:                  Expected Discharge Plan:  Home w Home Health Services  In-House Referral:     Discharge planning Services  CM Consult  Post Acute Care Choice:    Choice offered to:  Patient  DME Arranged:    DME Agency:     HH Arranged:  RN, Nurse's Aide, PT HH Agency:  [facility]  Status of Service:  In process, will continue to follow  If discussed at Long Length of Stay Meetings, dates discussed:    Additional Comments:  Epifanio Lesches, RN 04/08/2018, 12:58 PM

## 2018-04-08 NOTE — Progress Notes (Signed)
Family Medicine Teaching Service Daily Progress Note Intern Pager: [phone]  Patient name: Vicki Rogers Medical record number: [id number] Date of birth: 09-01-1945 Age: 72 y.o. Gender: female  Primary Care Provider: Jackie Plum, MD Consultants: cardiology, vascular  Code Status: FULL (discussed on admission)   Pt Overview and Major Events to Date:  9/29 - Admitted 9/30 [facility] with stent of LAD   Assessment and Plan: Vicki Rogers is a 72 y.o. female presenting with chest and abdominal pain. PMH is significant for DM-II, HTN, HLD, GERD, h/o CVA, h/o of diverticulosis.    Chest and abdominal pain, stable: s/p PCI with DES of LAD 9/30, Troponins overnight 9/29-9/30 1.13>6.18>8.79. Has history of CVA. TSH 1.943. EKG with signs of evolving anterolateral infarct. Lipid panel as of 04/07/2018 shows Cholesterol 244 and LDL 171, other values within normal limits. S/p Heparin bolus. 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  - Cardiac monitoring  - Zofran PRN nausea - AM BMP, CBC 10/31 - Cardiac rehab - Echo 9/30  T2DM, chronic: patient takes glipizide 5 mg daily at home. A1c 6.4 on 04/07/2018. Cbg in hospital 167.   - hold glipizide for now - sSSI - monitor CBGs   HTN, chronic, improved: BP 144/77 on 9/30.    - continue home amlodipine 5mg  - Metoprolol 25mg  BID - Hydralazine 5mg  q6hrs and Labetalol 10mg  IV PRN HTN  HLD: Hx of stroke and now MI. Not on medication at home, she reports she used to be on a statin but stopped around last year. - start Atorvastatin 80  H/o CVA in 1995, stable: Residual weakness in her left arm sometimes.   -continue to monitor   H/o diverticulosis.  Last time this bothered her was 57.    GERD. Stable. At home on omeprazole.   -continue to monitor   FEN/GI: heart healthy,   Prophylaxis: heparin infusion  Disposition: Admit to stepdown, attending Dr. Lum Babe   Subjective:  Patient seen this morning resting in bed with family at bedside.  She states she feels great this morning and has no complaints.  She denies current shortness of breath, chest pain, nausea, or abdominal pain.  Objective: Temp:  [98.2 F (36.8 C)-98.8 F (37.1 C)] 98.2 F (36.8 C) (09/30 0827) Pulse Rate:  [70-96] 81 (09/30 0827) Resp:  [15-22] 16 (09/30 0827) BP: (111-161)/(67-102) 111/67 (09/30 0827) SpO2:  [92 %-99 %] 95 % (09/30 0827) Weight:  [94.5 kg-109.8 kg] 94.5 kg (09/29 2303)  Physical Exam  Constitutional: She is oriented to person, place, and time. She appears well-developed and well-nourished. She does not appear ill.  HENT:  Head: Normocephalic.  Eyes: EOM are normal.  Neck: Normal range of motion.  Cardiovascular: Normal rate, regular rhythm, intact distal pulses and normal pulses.  Murmur (1/6 systolic) heard. Pulmonary/Chest: Effort normal and breath sounds normal.  Abdominal: Soft. Bowel sounds are normal. There is no tenderness.  Musculoskeletal: Normal range of motion.       Right lower leg: She exhibits no edema.       Left lower leg: She exhibits no edema.  Neurological: She is alert and oriented to person, place, and time.  Skin: Capillary refill takes less than 2 seconds.   Laboratory: Recent Labs  Lab 04/07/18 1610 04/07/18 1623 04/08/18 0416  WBC 8.8  --  13.8*  HGB 13.7 14.6 12.8  HCT 42.0 43.0  39.5  PLT 273  --  329   Recent Labs  Lab 04/07/18 1610 04/07/18 1623 04/08/18 0416  NA 138 141 140  K 3.8 3.8 3.4*  CL 104 102 105  CO2 25  --  24  BUN 15 17 18   CREATININE 0.93 0.90 1.10*  CALCIUM 9.6  --  9.7  PROT 8.0  --   --   BILITOT 0.5  --   --   ALKPHOS 119  --   --   ALT 16  --   --   AST 22  --   --   GLUCOSE 165* 167* 51*   Lipid Panel     Component Value Date/Time   CHOL 244 (H) 04/07/2018 2321   TRIG 56 04/07/2018  2321   HDL 62 04/07/2018 2321   CHOLHDL 3.9 04/07/2018 2321   VLDL 11 04/07/2018 2321   LDLCALC 171 (H) 04/07/2018 2321   Imaging/Diagnostic Tests: Dg Chest Port 1 View  Result Date: 04/07/2018 CLINICAL DATA:  Chest pain EXAM: PORTABLE CHEST 1 VIEW COMPARISON:  December 28, 2017 FINDINGS: There is mild interstitial thickening, stable. There is no frank edema or consolidation. Heart is borderline enlarged with pulmonary vascularity normal. No adenopathy. There is aortic atherosclerosis. There is bilateral carotid artery calcification. No bone lesions. IMPRESSION: Mild interstitial thickening without frank edema or consolidation. Mild cardiac enlargement. No adenopathy evident. There is aortic atherosclerosis as well as foci of calcification in both carotid arteries. Aortic Atherosclerosis (ICD10-I70.0). Electronically Signed   By: Bretta Bang III M.D.   On: 04/07/2018 15:47   Ct Angio Chest/abd/pel For Dissection W And/or W/wo  Result Date: 04/07/2018 CLINICAL DATA:  Chest and abdominal pain. EXAM: CT ANGIOGRAPHY CHEST, ABDOMEN AND PELVIS TECHNIQUE: Multidetector CT imaging through the chest, abdomen and pelvis was performed using the standard protocol during bolus administration of intravenous contrast. Multiplanar reconstructed images and MIPs were obtained and reviewed to evaluate the vascular anatomy. CONTRAST:  ISOVUE-370 IOPAMIDOL (ISOVUE-370) INJECTION 76% COMPARISON:  Radiograph of same day. FINDINGS: CTA CHEST FINDINGS Cardiovascular: Preferential opacification of the thoracic aorta. No evidence of thoracic aortic aneurysm or dissection. Normal heart size. No pericardial effusion. Mediastinum/Nodes: No enlarged mediastinal, hilar, or axillary lymph nodes. Thyroid gland, trachea, and esophagus demonstrate no significant findings. Lungs/Pleura: Lungs are clear. No pleural effusion or pneumothorax. Musculoskeletal: Moderate thoracic kyphosis is noted. No acute abnormality is noted. Review  of the MIP images confirms the above findings. CTA ABDOMEN AND PELVIS FINDINGS VASCULAR Aorta: Atherosclerosis of abdominal aorta is noted without aneurysm formation. 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 Celiac: Patent without evidence of aneurysm, dissection, vasculitis or significant stenosis. SMA: Patent without evidence of aneurysm, dissection, vasculitis or significant stenosis. Renals: Both renal arteries are patent without evidence of aneurysm, dissection, vasculitis, fibromuscular dysplasia or significant stenosis. IMA: Patent without evidence of aneurysm, dissection, vasculitis or significant stenosis. 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 No significant stenosis or other abnormality seen involving the iliac arteries. Veins: No obvious venous abnormality within the limitations of this arterial phase study. Review of the MIP images confirms the above findings. NON-VASCULAR Hepatobiliary: No focal liver abnormality is seen. No gallstones, gallbladder wall thickening, or biliary dilatation. Pancreas: Unremarkable. No pancreatic ductal dilatation or surrounding inflammatory changes. Spleen: Normal in size without focal abnormality. Adrenals/Urinary Tract: Adrenal glands are unremarkable. Kidneys are normal, without renal calculi, focal lesion, or hydronephrosis. Bladder is unremarkable. Stomach/Bowel: Stomach  is within normal limits. Appendix appears normal. No evidence of bowel wall thickening, distention, or inflammatory changes. Lymphatic: No significant adenopathy is noted. Reproductive: Uterus and bilateral adnexa are unremarkable. Other: No abdominal wall hernia or abnormality. No abdominopelvic ascites. Musculoskeletal: No acute or significant osseous findings. Review of the MIP images confirms the above findings. IMPRESSION: No evidence of thoracic or abdominal aortic aneurysm or dissection. 16 x 14 mm penetrating  atherosclerotic ulcer is seen arising anteriorly from infrarenal abdominal aorta. 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 No evidence of significant mesenteric or renal artery stenosis. Aortic Atherosclerosis (ICD10-I70.0). Electronically Signed   By: Lupita Raider, M.D.   On: 04/07/2018 21:32     Dollene Cleveland, DO 04/08/2018, 9:49 AM PGY-1, [facility] FPTS Intern pager: [phone], text pages welcome

## 2018-04-08 NOTE — Progress Notes (Signed)
Benefits check in process for Brilinta 90 mg twice a day.NCM to f/u with results. Isaak Delmundo RN,BSN,CM [phone] 

## 2018-04-08 NOTE — Progress Notes (Signed)
ANTICOAGULATION CONSULT NOTE - Initial Consult  Pharmacy Consult for Heparin Indication: chest pain/ACS  Allergies  Allergen Reactions  . Penicillins Anaphylaxis    Has patient had a PCN reaction causing immediate rash, facial/tongue/throat swelling, SOB or lightheadedness with hypotension: YES Has patient had a PCN reaction causing severe rash involving mucus membranes or skin necrosis:NO Has patient had a PCN reaction that required hospitalization YES Has patient had a PCN reaction occurring within the last 10 years: NO If all of the above answers are "NO", then may proceed with Cephalosporin use.  . Shrimp [Shellfish Allergy] Anaphylaxis    Patient Measurements: Height: 5\' 5"  (165.1 cm) Weight: 208 lb 6.4 oz (94.5 kg) IBW/kg (Calculated) : 57 Heparin Dosing Weight: 82.8 kg  Vital Signs: Temp: 98.8 F (37.1 C) (09/29 2303) Temp Source: Oral (09/29 2303) BP: 147/84 (09/29 2303) Pulse Rate: 70 (09/29 2303)  Labs: Recent Labs    04/07/18 1610 04/07/18 1623 04/07/18 1900 04/07/18 2321 04/08/18 0416  HGB 13.7 14.6  --   --  12.8  HCT 42.0 43.0  --   --  39.5  PLT 273  --   --   --  329  HEPARINUNFRC  --   --   --   --  0.43  CREATININE 0.93 0.90  --   --   --   TROPONINI  --   --  1.13* 6.18*  --     Estimated Creatinine Clearance: 65.2 mL/min (by C-G formula based on SCr of 0.9 mg/dL).   Medical History: Past Medical History:  Diagnosis Date  . Arthritis   . Diabetes mellitus without complication (HCC)   . Diverticulosis   . Hypertension   . Stroke [facility])     Assessment: 72 YOF who presented on 9/29 with CP. Cards consulted and per their initial evaluation appeared to be more GI related and advised against anticoagulation. Troponins have continued to trend up and now ED-MD is requesting to start Heparin for anticoagulation while awaiting cardiology re-evaluation in the AM.  The patient was not on anticoagulation PTA. Hx CVA noted - but not w/in the past 3  months. Baseline CBC wnl.   Heparin level = 0.43, therapeutic on 1000 units/hr  Goal of Therapy:  Heparin level 0.3-0.7 units/ml Monitor platelets by anticoagulation protocol: Yes   Plan:  - Continue heparin 1000 units/hr - Daily HL, CBC - Will continue to monitor for any signs/symptoms of bleeding and will follow up with heparin level in 6 hours   Thank you for allowing pharmacy to be a part of this patient's care.  Bayard Hugger, PharmD, BCPS, BCPPS Clinical Pharmacist  Pager: [phone]   04/08/2018 5:59 AM

## 2018-04-08 NOTE — Progress Notes (Addendum)
Progress Note  Patient Name: Vicki Rogers Date of Encounter: 04/08/2018  Primary Cardiologist: No primary care provider on file.   Subjective   The patient says that her epigastric discomfort resolved last night and she has had no recurrence of pain.  No chest pain or shortness of breath.  Inpatient Medications    Scheduled Meds: . amLODipine  5 mg Oral Daily  . insulin aspart  0-9 Units Subcutaneous TID WC  . iopamidol       Continuous Infusions: . heparin 1,000 Units/hr (04/08/18 0600)   PRN Meds: acetaminophen **OR** acetaminophen, senna-docusate   Vital Signs    Vitals:   04/07/18 2130 04/07/18 2200 04/07/18 2230 04/07/18 2303  BP: 136/81 127/75 127/85 (!) 147/84  Pulse: 79 70 78 70  Resp: 17 17 19 15   Temp:    98.8 F (37.1 C)  TempSrc:    Oral  SpO2: 94% 96% 94% 95%  Weight:    94.5 kg  Height:    5\' 5"  (1.651 m)    Intake/Output Summary (Last 24 hours) at 04/08/2018 0749 Last data filed at 04/08/2018 0600 Gross per 24 hour  Intake 114.68 ml  Output -  Net 114.68 ml   Filed Weights   04/07/18 1515 04/07/18 2303  Weight: 109.8 kg 94.5 kg    Telemetry    This rhythm in the 70s-80s- Personally Reviewed  ECG    Sinus rhythm with ST changes and T wave inversions- Personally Reviewed  Physical Exam   GEN: No acute distress.   Neck: No JVD Cardiac: RRR, harsh 3/6 systolic murmur best heard in the left chest, radiating to the carotids Respiratory: Clear to auscultation bilaterally. GI: Soft, nontender, non-distended  MS: No edema; No deformity. Neuro:  Nonfocal  Psych: Normal affect   Labs    Chemistry Recent Labs  Lab 04/07/18 1610 04/07/18 1623 04/08/18 0416  NA 138 141 140  K 3.8 3.8 3.4*  CL 104 102 105  CO2 25  --  24  GLUCOSE 165* 167* 51*  BUN 15 17 18   CREATININE 0.93 0.90 1.10*  CALCIUM 9.6  --  9.7  PROT 8.0  --   --   ALBUMIN 3.7  --   --   AST 22  --   --   ALT 16  --   --   ALKPHOS 119  --   --   BILITOT 0.5  --    --   GFRNONAA >60  --  49*  GFRAA >60  --  57*  ANIONGAP 9  --  11     Hematology Recent Labs  Lab 04/07/18 1610 04/07/18 1623 04/08/18 0416  WBC 8.8  --  13.8*  RBC 5.06  --  4.72  HGB 13.7 14.6 12.8  HCT 42.0 43.0 39.5  MCV 83.0  --  83.7  MCH 27.1  --  27.1  MCHC 32.6  --  32.4  RDW 14.7  --  15.2  PLT 273  --  329    Cardiac Enzymes Recent Labs  Lab 04/07/18 1900 04/07/18 2321 04/08/18 0416  TROPONINI 1.13* 6.18* 8.79*    Recent Labs  Lab 04/07/18 1622  TROPIPOC 0.36*     BNPNo results for input(s): BNP, PROBNP in the last 168 hours.   DDimer  Recent Labs  Lab 04/07/18 1610  DDIMER 0.73*     Radiology    Dg Chest Port 1 View  Result Date: 04/07/2018 CLINICAL DATA:  Chest pain EXAM:  PORTABLE CHEST 1 VIEW COMPARISON:  December 28, 2017 FINDINGS: There is mild interstitial thickening, stable. There is no frank edema or consolidation. Heart is borderline enlarged with pulmonary vascularity normal. No adenopathy. There is aortic atherosclerosis. There is bilateral carotid artery calcification. No bone lesions. IMPRESSION: Mild interstitial thickening without frank edema or consolidation. Mild cardiac enlargement. No adenopathy evident. There is aortic atherosclerosis as well as foci of calcification in both carotid arteries. Aortic Atherosclerosis (ICD10-I70.0). Electronically Signed   By: Bretta Bang III M.D.   On: 04/07/2018 15:47   Ct Angio Chest/abd/pel For Dissection W And/or W/wo  Result Date: 04/07/2018 CLINICAL DATA:  Chest and abdominal pain. EXAM: CT ANGIOGRAPHY CHEST, ABDOMEN AND PELVIS TECHNIQUE: Multidetector CT imaging through the chest, abdomen and pelvis was performed using the standard protocol during bolus administration of intravenous contrast. Multiplanar reconstructed images and MIPs were obtained and reviewed to evaluate the vascular anatomy. CONTRAST:  ISOVUE-370 IOPAMIDOL (ISOVUE-370) INJECTION 76% COMPARISON:  Radiograph of same  day. FINDINGS: CTA CHEST FINDINGS Cardiovascular: Preferential opacification of the thoracic aorta. No evidence of thoracic aortic aneurysm or dissection. Normal heart size. No pericardial effusion. Mediastinum/Nodes: No enlarged mediastinal, hilar, or axillary lymph nodes. Thyroid gland, trachea, and esophagus demonstrate no significant findings. Lungs/Pleura: Lungs are clear. No pleural effusion or pneumothorax. Musculoskeletal: Moderate thoracic kyphosis is noted. No acute abnormality is noted. Review of the MIP images confirms the above findings. CTA ABDOMEN AND PELVIS FINDINGS VASCULAR Aorta: Atherosclerosis of abdominal aorta is noted without aneurysm formation. 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 Celiac: Patent without evidence of aneurysm, dissection, vasculitis or significant stenosis. SMA: Patent without evidence of aneurysm, dissection, vasculitis or significant stenosis. Renals: Both renal arteries are patent without evidence of aneurysm, dissection, vasculitis, fibromuscular dysplasia or significant stenosis. IMA: Patent without evidence of aneurysm, dissection, vasculitis or significant stenosis. 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 No significant stenosis or other abnormality seen involving the iliac arteries. Veins: No obvious venous abnormality within the limitations of this arterial phase study. Review of the MIP images confirms the above findings. NON-VASCULAR Hepatobiliary: No focal liver abnormality is seen. No gallstones, gallbladder wall thickening, or biliary dilatation. Pancreas: Unremarkable. No pancreatic ductal dilatation or surrounding inflammatory changes. Spleen: Normal in size without focal abnormality. Adrenals/Urinary Tract: Adrenal glands are unremarkable. Kidneys are normal, without renal calculi, focal lesion, or hydronephrosis. Bladder is unremarkable. Stomach/Bowel: Stomach is within  normal limits. Appendix appears normal. No evidence of bowel wall thickening, distention, or inflammatory changes. Lymphatic: No significant adenopathy is noted. Reproductive: Uterus and bilateral adnexa are unremarkable. Other: No abdominal wall hernia or abnormality. No abdominopelvic ascites. Musculoskeletal: No acute or significant osseous findings. Review of the MIP images confirms the above findings. IMPRESSION: No evidence of thoracic or abdominal aortic aneurysm or dissection. 16 x 14 mm penetrating atherosclerotic ulcer is seen arising anteriorly from infrarenal abdominal aorta. 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 No evidence of significant mesenteric or renal artery stenosis. Aortic Atherosclerosis (ICD10-I70.0). Electronically Signed   By: Lupita Raider, M.D.   On: 04/07/2018 21:32    Cardiac Studies   Plan for cardiac cath today  Patient Profile     73 y.o. female with past medical history of diabetes mellitus type 2, diverticulosis, hypertension and stroke presented for epigastric pain.  Developed elevated troponin and EKG changes.  Assessment & Plan    NSTEMI; -Pt presented with epigastric discomfort  and no chest pain or shortness of breath.  Her discomfort resolved last night.  LFTs were normal -Troponins trended upward to 8.79 this morning -EKG originally with very mild ST elevations but overnight she developed T wave inversions -CTA of chest showed No evidence of thoracic or abdominal aortic aneurysm or dissection. 16 x 14 mm penetrating atherosclerotic ulcer is seen arising anteriorly from infrarenal abdominal aorta. 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 No evidence of significant mesenteric or renal artery stenosis. -Plan for cardiac catheterization today to further evaluate her coronary arteries -IV heparin is infusing.  Will initiate beta-blocker and aspirin.  The patient understands that risks included but are not limited to stroke (1 in  1000), death (1 in 1000), kidney failure [usually temporary] (1 in 500), bleeding (1 in 200), allergic reaction [possibly serious] (1 in 200).   Hyperlipidemia -LDL 171, total cholesterol 244, HDL 62 -We will initiate high intensity statin  Hypertension -Blood pressure was elevated on presentation 161/102 possibly related to pain.  Blood pressure improved overnight  Diabetes type 2 -A1c 6.4, at goal of less than 7  For questions or updates, please contact [facility] Please consult [email or web address] for contact info under        Signed, Berton Bon, NP  04/08/2018, 7:49 AM   22 ------------------------------------------------------------------------------------   Progress Note  Patient Name: Vicki Rogers Date of Encounter: 04/08/2018  Primary Cardiologist: No primary care provider on file.   Subjective   History and all data above reviewed.  Patient examined.  I agree with the findings as above. All available labs, radiology testing, previous records reviewed. Agree with documented assessment and plan.    Vicki Rogers is a pleasant 72 year old female who was admitted to the hospital yesterday for epigastric and right upper quadrant discomfort.  She was evaluated yesterday by my colleague Dr. Hillis Range.  She was monitored overnight, and was found to have a rising troponin with dynamic ECG changes concerning for LAD distribution.  Her pain has resolved, however her labs and ECG are concerning for ischemia.  She did not receive dual antiplatelet therapy overnight; she has type 2 diabetes and significant calcification of the LAD, with calcification seen in the right coronary artery and left circumflex as well, as seen on dissection protocol CT angiogram chest abdomen pelvis.  She describes her discomfort is primarily epigastric and right upper quadrant, and the pain began at approximately 11:30 AM yesterday.  It did not worsen with exertion, and continued until the evening.   This morning she is pain-free.  She does not participate in change with activity at home, and therefore is unclear as to whether she has experienced this abdominal discomfort with exertion in the past.  With regard to coronary angiography, she has no major bleeding history.  Inpatient Medications    Scheduled Meds: . [MAR Hold] amLODipine  5 mg Oral Daily  . [MAR Hold] aspirin EC  81 mg Oral Daily  . [MAR Hold] atorvastatin  80 mg Oral q1800  . [MAR Hold] insulin aspart  0-9 Units Subcutaneous TID WC  . [MAR Hold] metoprolol tartrate  25 mg Oral BID  . sodium chloride flush  3 mL Intravenous Q12H   Continuous Infusions: . sodium chloride    . sodium chloride    . heparin 1,000 Units/hr (04/08/18 0600)   PRN Meds: sodium chloride, [MAR Hold] acetaminophen **OR** [MAR Hold] acetaminophen, [MAR Hold] senna-docusate, sodium chloride flush   Vital Signs    Vitals:  04/07/18 2230 04/07/18 2303 04/08/18 0827 04/08/18 1031  BP: 127/85 (!) 147/84 111/67   Pulse: 78 70 81   Resp: 19 15 16    Temp:  98.8 F (37.1 C) 98.2 F (36.8 C)   TempSrc:  Oral Oral   SpO2: 94% 95% 95% 97%  Weight:  94.5 kg    Height:  5\' 5"  (1.651 m)      Intake/Output Summary (Last 24 hours) at 04/08/2018 1039 Last data filed at 04/08/2018 0600 Gross per 24 hour  Intake 114.68 ml  Output -  Net 114.68 ml   Filed Weights   04/07/18 1515 04/07/18 2303  Weight: 109.8 kg 94.5 kg    Telemetry    4 beat run of NSVT, infrequent PVCs. Otherwise unremarkable - Personally Reviewed  ECG    Sinus rhythm with evolving T wave abnormalities in the anterior leads - Personally Reviewed  Physical Exam   GEN: Appears worried about her current medical condition, in no physical distress. Neck: No apparent JVD, however limited assessment.  Cardiac: regular rhythm, normal rate. 3/6 systolic ejection murmur heard best of RUSB, late peaking but preserved S2. 4/4 right radial pulse, 3/4 left radial pulse. Respiratory:  shallow breath sounds, no apparent crackles or wheezes.   GI: Soft, nontender, non-distended. Specifically, no appreciable organomegaly, and negative Murphy's sign.  MS: No edema; No deformity. Neuro:  Nonfocal  Psych: Normal affect   Labs    Chemistry Recent Labs  Lab 04/07/18 1610 04/07/18 1623 04/08/18 0416  NA 138 141 140  K 3.8 3.8 3.4*  CL 104 102 105  CO2 25  --  24  GLUCOSE 165* 167* 51*  BUN 15 17 18   CREATININE 0.93 0.90 1.10*  CALCIUM 9.6  --  9.7  PROT 8.0  --   --   ALBUMIN 3.7  --   --   AST 22  --   --   ALT 16  --   --   ALKPHOS 119  --   --   BILITOT 0.5  --   --   GFRNONAA >60  --  49*  GFRAA >60  --  57*  ANIONGAP 9  --  11     Hematology Recent Labs  Lab 04/07/18 1610 04/07/18 1623 04/08/18 0416  WBC 8.8  --  13.8*  RBC 5.06  --  4.72  HGB 13.7 14.6 12.8  HCT 42.0 43.0 39.5  MCV 83.0  --  83.7  MCH 27.1  --  27.1  MCHC 32.6  --  32.4  RDW 14.7  --  15.2  PLT 273  --  329    Cardiac Enzymes Recent Labs  Lab 04/07/18 1900 04/07/18 2321 04/08/18 0416  TROPONINI 1.13* 6.18* 8.79*    Recent Labs  Lab 04/07/18 1622  TROPIPOC 0.36*     BNPNo results for input(s): BNP, PROBNP in the last 168 hours.   DDimer  Recent Labs  Lab 04/07/18 1610  DDIMER 0.73*     Radiology    Dg Chest Port 1 View  Result Date: 04/07/2018 CLINICAL DATA:  Chest pain EXAM: PORTABLE CHEST 1 VIEW COMPARISON:  December 28, 2017 FINDINGS: There is mild interstitial thickening, stable. There is no frank edema or consolidation. Heart is borderline enlarged with pulmonary vascularity normal. No adenopathy. There is aortic atherosclerosis. There is bilateral carotid artery calcification. No bone lesions. IMPRESSION: Mild interstitial thickening without frank edema or consolidation. Mild cardiac enlargement. No adenopathy evident. There is aortic atherosclerosis as  well as foci of calcification in both carotid arteries. Aortic Atherosclerosis (ICD10-I70.0).  Electronically Signed   By: Bretta Bang III M.D.   On: 04/07/2018 15:47   Ct Angio Chest/abd/pel For Dissection W And/or W/wo  Result Date: 04/07/2018 CLINICAL DATA:  Chest and abdominal pain. EXAM: CT ANGIOGRAPHY CHEST, ABDOMEN AND PELVIS TECHNIQUE: Multidetector CT imaging through the chest, abdomen and pelvis was performed using the standard protocol during bolus administration of intravenous contrast. Multiplanar reconstructed images and MIPs were obtained and reviewed to evaluate the vascular anatomy. CONTRAST:  ISOVUE-370 IOPAMIDOL (ISOVUE-370) INJECTION 76% COMPARISON:  Radiograph of same day. FINDINGS: CTA CHEST FINDINGS Cardiovascular: Preferential opacification of the thoracic aorta. No evidence of thoracic aortic aneurysm or dissection. Normal heart size. No pericardial effusion. Mediastinum/Nodes: No enlarged mediastinal, hilar, or axillary lymph nodes. Thyroid gland, trachea, and esophagus demonstrate no significant findings. Lungs/Pleura: Lungs are clear. No pleural effusion or pneumothorax. Musculoskeletal: Moderate thoracic kyphosis is noted. No acute abnormality is noted. Review of the MIP images confirms the above findings. CTA ABDOMEN AND PELVIS FINDINGS VASCULAR Aorta: Atherosclerosis of abdominal aorta is noted without aneurysm formation. 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 Celiac: Patent without evidence of aneurysm, dissection, vasculitis or significant stenosis. SMA: Patent without evidence of aneurysm, dissection, vasculitis or significant stenosis. Renals: Both renal arteries are patent without evidence of aneurysm, dissection, vasculitis, fibromuscular dysplasia or significant stenosis. IMA: Patent without evidence of aneurysm, dissection, vasculitis or significant stenosis. 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 No significant stenosis or other abnormality seen involving the iliac  arteries. Veins: No obvious venous abnormality within the limitations of this arterial phase study. Review of the MIP images confirms the above findings. NON-VASCULAR Hepatobiliary: No focal liver abnormality is seen. No gallstones, gallbladder wall thickening, or biliary dilatation. Pancreas: Unremarkable. No pancreatic ductal dilatation or surrounding inflammatory changes. Spleen: Normal in size without focal abnormality. Adrenals/Urinary Tract: Adrenal glands are unremarkable. Kidneys are normal, without renal calculi, focal lesion, or hydronephrosis. Bladder is unremarkable. Stomach/Bowel: Stomach is within normal limits. Appendix appears normal. No evidence of bowel wall thickening, distention, or inflammatory changes. Lymphatic: No significant adenopathy is noted. Reproductive: Uterus and bilateral adnexa are unremarkable. Other: No abdominal wall hernia or abnormality. No abdominopelvic ascites. Musculoskeletal: No acute or significant osseous findings. Review of the MIP images confirms the above findings. IMPRESSION: No evidence of thoracic or abdominal aortic aneurysm or dissection. 16 x 14 mm penetrating atherosclerotic ulcer is seen arising anteriorly from infrarenal abdominal aorta. 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 No evidence of significant mesenteric or renal artery stenosis. Aortic Atherosclerosis (ICD10-I70.0). Electronically Signed   By: Lupita Raider, M.D.   On: 04/07/2018 21:32    Cardiac Studies   Coronary angiogram pending.  Patient Profile     72 y.o. female with a past medical history of diabetes mellitus 2, hypertension, and stroke, now presenting with rising troponins, dynamic anterior ECG changes, and atypical chest pain concerning for myocardial ischemia.  She received 1 sublingual nitroglycerin on presentation on 324 mg of aspirin.  She has not otherwise received dual antiplatelet therapy.  She is currently pain-free.  She will be sent for coronary angiography  today.  We discussed the rationale, risks, and benefits of coronary angiography with possible PCI.  She understands that she will need to take dual antiplatelet therapy daily for the prescribed amount of time (usually 6 to 12 months). The  patient understands that risks included but are not limited to stroke (1 in 1000), death (1 in 1000), kidney failure [usually temporary] (1 in 500), bleeding (1 in 200), allergic reaction [possibly serious] (1 in 200).  We discussed the possibility of multivessel coronary artery disease and need for coronary artery bypass grafting.  At the moment the patient is not inclined to go for coronary artery bypass grafting electively, however we plan to discuss this if necessary after angiography.  Assessment & Plan    Principal Problem:   Non-ST elevation (NSTEMI) myocardial infarction [facility]) Active Problems:   Chest pain   Abdominal pain, epigastric   Hyperlipidemia   Penetrating ulcer of aorta (HCC)   Type 2 diabetes mellitus with complication, without long-term current use of insulin (HCC)   Aortic stenosis  I agree with the assessment and plan of my colleague Berton Bon, NP.  Coronary angiography plus minus PCI for non-ST elevation myocardial infarction.  I am concerned she will have multivessel disease at which point we will have to have a discussion about the best course of revascularization, if the patient is amenable.  Aggressive risk factor modification should be pursued, including treatment of hyperlipidemia with atorvastatin 80 mg daily.  She has had hypertension while in hospital, and appears to be taking amlodipine 5 mg daily at home, this will need to be assessed after angiography.  Her presentation is more suggestive of coronary ischemia moreso than abdominal discomfort from penetrating ulcer or abdominal ischemia.  On physical exam she appears to have at least moderate aortic valve stenosis with a late peaking murmur.  It would be reasonable to  consider echocardiography to define the severity of her aortic stenosis and overall cardiovascular function.  Patient questions were answered to the best my ability, she demonstrated understanding of our conversation and plan.   For questions or updates, please contact [facility] Please consult [email or web address] for contact info under     Signed, Parke Poisson, MD  04/08/2018, 10:39 AM

## 2018-04-08 NOTE — Interval H&P Note (Signed)
Cath Lab Visit (complete for each Cath Lab visit)  Clinical Evaluation Leading to the Procedure:   ACS: Yes.    Non-ACS:    Anginal Classification: CCS IV  Anti-ischemic medical therapy: Maximal Therapy (2 or more classes of medications)  Non-Invasive Test Results: No non-invasive testing performed  Prior CABG: No previous CABG  Evolving EKG changes overnight consistent with anterolateral infarct, increasing troponin. Plan for cath possible PCI today. No chest pain at present.   History and Physical Interval Note:  04/08/2018 10:42 AM  Vicki Rogers  has presented today for surgery, with the diagnosis of cp  The various methods of treatment have been discussed with the patient and family. After consideration of risks, benefits and other options for treatment, the patient has consented to  Procedure(s): LEFT HEART CATH AND CORONARY ANGIOGRAPHY (N/A) as a surgical intervention .  The patient's history has been reviewed, patient examined, no change in status, stable for surgery.  I have reviewed the patient's chart and labs.  Questions were answered to the patient's satisfaction.     Tonny Bollman

## 2018-04-08 NOTE — Progress Notes (Signed)
Cardiology progress note: Called to patient's bedside for change in ECG.  Exam: Patient resting comfortably and denies any chest/abdominal pain currently. Hemodynamically stable.  Initial ECGs from 04/07/18 revealed subtle ST elevations in the inferior leads and maybe some precordial leads. The last ECG from today at 01:07 reveals ST elevations (more pronounced) in V1 and V2 and inverted T waves across the precordium.  Plan: Since the patient is asymptomatic we will hold off on taking her emergently to the cath lab. - Continue ASA - Continue IV heparin - Start IV NTG if there is recurrence of chest pain - Make the patient NPO for cardiac catheterization in the AM  Case discussed with Interventional Cardiology attending (Dr. Excell Seltzer)  Dr. Deforest Hoyles Cardiology cross cover note

## 2018-04-08 NOTE — Progress Notes (Addendum)
FPTS Interim Progress Note  Patient's troponin came back elevated at 6.18.  Spoke with patient's nurse, who said she had not been complaining of worsening chest pain.  EKG ordered and showed new ST elevations in V1, V2.  Called Cardiology, who said they would come see patient.    Addendum: cardiology called back and said she is denying chest pain. They will take her to cath lab in the AM.  Make her NPO, continue IV heparin and aspirin.  Give nitro if pt complains of chest pain.    Sandre Kitty, MD 04/08/2018, 2:11 AM PGY-1, [facility] Family Medicine Service pager [phone]

## 2018-04-08 NOTE — Progress Notes (Signed)
PT Cancellation Note  Patient Details Name: Lariyah Shetterly MRN: [id number] DOB: 05-Jun-1946   Cancelled Treatment:    Reason Eval/Treat Not Completed: Other (comment) RN reporting pt with visit to cath lab and still has TR band. Requesting to hold until tomorrow. Will follow up as schedule allows.   Gladys Damme, PT, DPT  Acute Rehabilitation Services  Pager: [phone] Office: [phone]  Lehman Prom 04/08/2018, 2:21 PM

## 2018-04-08 NOTE — Care Management (Signed)
04-08-18  BENEFITS CHECK:  # 5.  S/W ANNA  @ [employer] # [phone]  BRILINTA  90 MG BID COVER- YES CO-PAY- $ 48.00 TIER- 3 DRUG  PRIOR APPROVAL- NO NO DEDUCTIBLE  PREFERRED PHARMACY : YES  [facility]

## 2018-04-09 ENCOUNTER — Encounter (HOSPITAL_COMMUNITY): Payer: Self-pay | Admitting: General Practice

## 2018-04-09 ENCOUNTER — Inpatient Hospital Stay (HOSPITAL_COMMUNITY): Payer: Medicare HMO

## 2018-04-09 ENCOUNTER — Telehealth: Payer: Self-pay | Admitting: Vascular Surgery

## 2018-04-09 DIAGNOSIS — R079 Chest pain, unspecified: Secondary | ICD-10-CM

## 2018-04-09 LAB — GLUCOSE, CAPILLARY
Glucose-Capillary: 123 mg/dL — ABNORMAL HIGH (ref 70–99)
Glucose-Capillary: 109 mg/dL — ABNORMAL HIGH (ref 70–99)
Glucose-Capillary: 93 mg/dL (ref 70–99)

## 2018-04-09 LAB — BASIC METABOLIC PANEL
Anion gap: 4 — ABNORMAL LOW (ref 5–15)
BUN: 16 mg/dL (ref 8–23)
CHLORIDE: 110 mmol/L (ref 98–111)
CO2: 24 mmol/L (ref 22–32)
Calcium: 8.7 mg/dL — ABNORMAL LOW (ref 8.9–10.3)
Creatinine, Ser: 1.06 mg/dL — ABNORMAL HIGH (ref 0.44–1.00)
GFR calc non Af Amer: 52 mL/min — ABNORMAL LOW (ref >60)
GFR, EST AFRICAN AMERICAN: 60 mL/min — AB (ref >60)
Glucose, Bld: 124 mg/dL — ABNORMAL HIGH (ref 70–99)
POTASSIUM: 3.7 mmol/L (ref 3.5–5.1)
SODIUM: 138 mmol/L (ref 135–145)

## 2018-04-09 LAB — CBC
HEMATOCRIT: 34.8 % — AB (ref 36.0–46.0)
HEMOGLOBIN: 11.4 g/dL — AB (ref 12.0–15.0)
MCH: 27.5 pg (ref 26.0–34.0)
MCHC: 32.8 g/dL (ref 30.0–36.0)
MCV: 83.9 fL (ref 78.0–100.0)
Platelets: 262 10*3/uL (ref 150–400)
RBC: 4.15 MIL/uL (ref 3.87–5.11)
RDW: 15 % (ref 11.5–15.5)
WBC: 7.9 10*3/uL (ref 4.0–10.5)

## 2018-04-09 LAB — ECHOCARDIOGRAM COMPLETE
Height: 65 in
Weight: 3334.4 oz

## 2018-04-09 MED ORDER — ANGIOPLASTY BOOK
Freq: Once | Status: AC
  Administered 2018-04-09: 03:00:00
  Filled 2018-04-09: qty 1

## 2018-04-09 MED ORDER — METOPROLOL TARTRATE 25 MG PO TABS: 25.0000 mg | ORAL_TABLET | Freq: Two times a day (BID) | ORAL | 0 refills | Status: DC

## 2018-04-09 MED ORDER — NITROGLYCERIN 0.4 MG SL SUBL: 0.4000 mg | SUBLINGUAL_TABLET | SUBLINGUAL | 0 refills | Status: AC | PRN

## 2018-04-09 MED ORDER — TICAGRELOR 90 MG PO TABS: 90.0000 mg | ORAL_TABLET | Freq: Two times a day (BID) | ORAL | 0 refills | Status: DC

## 2018-04-09 MED ORDER — HEART ATTACK BOUNCING BOOK
Freq: Once | Status: AC
  Administered 2018-04-09: 03:00:00
  Filled 2018-04-09: qty 1

## 2018-04-09 MED ORDER — LISINOPRIL 10 MG PO TABS
10.0000 mg | ORAL_TABLET | Freq: Every day | ORAL | Status: DC
  Administered 2018-04-09: 10 mg via ORAL
  Filled 2018-04-09: qty 1

## 2018-04-09 MED ORDER — LISINOPRIL 5 MG PO TABS: 5.0000 mg | ORAL_TABLET | Freq: Every day | ORAL | 0 refills | Status: DC

## 2018-04-09 MED ORDER — ASPIRIN 81 MG PO TBEC: 81.0000 mg | DELAYED_RELEASE_TABLET | Freq: Every day | ORAL | 0 refills | Status: AC

## 2018-04-09 MED ORDER — ATORVASTATIN CALCIUM 80 MG PO TABS: 80.0000 mg | ORAL_TABLET | Freq: Every day | ORAL | 0 refills | Status: DC

## 2018-04-09 NOTE — Progress Notes (Signed)
Family Medicine Teaching Service Daily Progress Note Intern Pager: [phone]  Patient name: Vicki Rogers Medical record number: [id number] Date of birth: August 25, 1945 Age: 72 y.o. Gender: female  Primary Care Provider: Jackie Plum, MD Consultants: cardiology, vascular  Code Status: FULL (discussed on admission)   Pt Overview and Major Events to Date:  9/29 - Admitted 9/30 [facility] with stent of LAD   Assessment and Plan: Vicki Rogers is a 72 y.o. female presenting with chest and abdominal pain. PMH is significant for DM-II, HTN, HLD, GERD, h/o CVA, h/o of diverticulosis.    Chest and abdominal pain, Stable:  s/p PCI with DES of LAD 9/30.  EKG this AM consistent with yesterday and with mildly prolonged QTc at 504.  Pharm notes no offending agents.  Denies new chest pain, SOB. 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  - Cardiac monitoring  - Zofran PRN nausea - Cardiac rehab - Echo pending  Anemia Hgb 11.4 this AM, down from 12.8.  Patient asymptomatic. - monitor CBC  T2DM: Chronic On glipizide 5 mg daily at home. A1c 6.4 on 04/07/2018. CBG 123 this AM. - hold glipizide - sSSI - monitor CBGs   HTN: Chronic - cont home amlodipine 5mg  - Metoprolol 25mg  BID - Hydralazine 5mg  q6hrs and Labetalol 10mg  IV PRN HTN  HLD: Chronic  Hx of stroke and now MI. Not on medication at home, she reports she used to be on a statin but stopped around last year. - cont Atorvastatin 80 - lipid profile and LFTs in 6-8 wks outpatient  H/o CVA in 1995, stable: Residual weakness in her left arm sometimes.   -continue to monitor   H/o diverticulosis: Chronic, stable.   Last problem 1989   GERD. Stable. At home on omeprazole.   -continue to monitor   FEN/GI: heart  healthy,  Prophylaxis: heparin infusion  Disposition: pending clinical improvement  Subjective:  Feels "a little weak" this AM.  No chest pain this AM or overnight.  Objective: Temp:  [98.2 F (36.8 C)-98.3 F (36.8 C)] 98.2 F (36.8 C) (10/01 0400) Pulse Rate:  [72-93] 73 (10/01 0400) Resp:  [13-30] 20 (10/01 0400) BP: (98-149)/(46-88) 107/59 (10/01 0400) SpO2:  [93 %-100 %] 97 % (10/01 0400)  Physical Exam: General: 72 y.o. female in NAD Cardio: RRR, no m/r/g Lungs: CTAB, no wheezing, no rhonchi, no crackles Abdomen: Soft, non-tender to palpation, positive bowel sounds Skin: warm and dry Extremities: No edema  Laboratory: Recent Labs  Lab 04/07/18 1610 04/07/18 1623 04/08/18 0416 04/09/18 0333  WBC 8.8  --  13.8* 7.9  HGB 13.7 14.6 12.8 11.4*  HCT 42.0 43.0 39.5 34.8*  PLT 273  --  329 262   Recent Labs  Lab 04/07/18 1610 04/07/18 1623 04/08/18 0416 04/09/18 0333  NA 138 141 140 138  K 3.8 3.8 3.4* 3.7  CL 104 102 105 110  CO2 25  --  24 24  BUN 15 17 18 16   CREATININE 0.93 0.90 1.10* 1.06*  CALCIUM 9.6  --  9.7 8.7*  PROT 8.0  --   --   --   BILITOT 0.5  --   --   --   ALKPHOS 119  --   --   --   ALT 16  --   --   --  AST 22  --   --   --   GLUCOSE 165* 167* 51* 124*   Lipid Panel     Component Value Date/Time   CHOL 244 (H) 04/07/2018 2321   TRIG 56 04/07/2018 2321   HDL 62 04/07/2018 2321   CHOLHDL 3.9 04/07/2018 2321   VLDL 11 04/07/2018 2321   LDLCALC 171 (H) 04/07/2018 2321   Imaging/Diagnostic Tests: Dg Chest Port 1 View  Result Date: 04/07/2018 CLINICAL DATA:  Chest pain EXAM: PORTABLE CHEST 1 VIEW COMPARISON:  December 28, 2017 FINDINGS: There is mild interstitial thickening, stable. There is no frank edema or consolidation. Heart is borderline enlarged with pulmonary vascularity normal. No adenopathy. There is aortic atherosclerosis. There is bilateral carotid artery calcification. No bone lesions. IMPRESSION: Mild interstitial  thickening without frank edema or consolidation. Mild cardiac enlargement. No adenopathy evident. There is aortic atherosclerosis as well as foci of calcification in both carotid arteries. Aortic Atherosclerosis (ICD10-I70.0). Electronically Signed   By: Bretta Bang III M.D.   On: 04/07/2018 15:47   Ct Angio Chest/abd/pel For Dissection W And/or W/wo  Result Date: 04/07/2018 CLINICAL DATA:  Chest and abdominal pain. EXAM: CT ANGIOGRAPHY CHEST, ABDOMEN AND PELVIS TECHNIQUE: Multidetector CT imaging through the chest, abdomen and pelvis was performed using the standard protocol during bolus administration of intravenous contrast. Multiplanar reconstructed images and MIPs were obtained and reviewed to evaluate the vascular anatomy. CONTRAST:  ISOVUE-370 IOPAMIDOL (ISOVUE-370) INJECTION 76% COMPARISON:  Radiograph of same day. FINDINGS: CTA CHEST FINDINGS Cardiovascular: Preferential opacification of the thoracic aorta. No evidence of thoracic aortic aneurysm or dissection. Normal heart size. No pericardial effusion. Mediastinum/Nodes: No enlarged mediastinal, hilar, or axillary lymph nodes. Thyroid gland, trachea, and esophagus demonstrate no significant findings. Lungs/Pleura: Lungs are clear. No pleural effusion or pneumothorax. Musculoskeletal: Moderate thoracic kyphosis is noted. No acute abnormality is noted. Review of the MIP images confirms the above findings. CTA ABDOMEN AND PELVIS FINDINGS VASCULAR Aorta: Atherosclerosis of abdominal aorta is noted without aneurysm formation. 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 Celiac: Patent without evidence of aneurysm, dissection, vasculitis or significant stenosis. SMA: Patent without evidence of aneurysm, dissection, vasculitis or significant stenosis. Renals: Both renal arteries are patent without evidence of aneurysm, dissection, vasculitis, fibromuscular dysplasia or significant  stenosis. IMA: Patent without evidence of aneurysm, dissection, vasculitis or significant stenosis. 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 No significant stenosis or other abnormality seen involving the iliac arteries. Veins: No obvious venous abnormality within the limitations of this arterial phase study. Review of the MIP images confirms the above findings. NON-VASCULAR Hepatobiliary: No focal liver abnormality is seen. No gallstones, gallbladder wall thickening, or biliary dilatation. Pancreas: Unremarkable. No pancreatic ductal dilatation or surrounding inflammatory changes. Spleen: Normal in size without focal abnormality. Adrenals/Urinary Tract: Adrenal glands are unremarkable. Kidneys are normal, without renal calculi, focal lesion, or hydronephrosis. Bladder is unremarkable. Stomach/Bowel: Stomach is within normal limits. Appendix appears normal. No evidence of bowel wall thickening, distention, or inflammatory changes. Lymphatic: No significant adenopathy is noted. Reproductive: Uterus and bilateral adnexa are unremarkable. Other: No abdominal wall hernia or abnormality. No abdominopelvic ascites. Musculoskeletal: No acute or significant osseous findings. Review of the MIP images confirms the above findings. IMPRESSION: No evidence of thoracic or abdominal aortic aneurysm or dissection. 16 x 14 mm penetrating atherosclerotic ulcer is seen arising anteriorly from infrarenal abdominal aorta. 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 No  evidence of significant mesenteric or renal artery stenosis. Aortic Atherosclerosis (ICD10-I70.0). Electronically Signed   By: Lupita Raider, M.D.   On: 04/07/2018 21:32     Meccariello, Solmon Ice, DO 04/09/2018, 6:41 AM PGY-1, [facility] FPTS Intern pager: [phone], text pages welcome

## 2018-04-09 NOTE — Progress Notes (Signed)
  Echocardiogram 2D Echocardiogram has been performed.  [name] 04/09/2018, 1:02 PM

## 2018-04-09 NOTE — Telephone Encounter (Signed)
sch appt phone NA mld ltr 10/10/18 9am Aorto/iliac 945am Gevena Cotton MD

## 2018-04-09 NOTE — Progress Notes (Signed)
CARDIAC REHAB PHASE I   PRE:  Rate/Rhythm: 92 SR  BP:  Supine:   Sitting: 113/67  Standing:    SaO2: 95%RA  MODE:  Ambulation: 275 ft   POST:  Rate/Rhythm: 98 SR  BP:  Supine:   Sitting: 144/74  Standing:    SaO2: 99%RA 0905-1000 Pt walked 275 ft on RA with gait belt use and rolling walker. Pt stated she has walker and cane at home. No CP. Discussed CRP 2 [place] but pt does not think she will be able to attend due to no transportation. Reviewed MI restrictions, gave diabetic and heart healthy diets, reviewed NTG use and encouraged walking as tolerated. PT to see prior to discharge. To recliner with chair alarm.   Luetta Nutting, RN BSN  04/09/2018 9:56 AM

## 2018-04-09 NOTE — Discharge Summary (Signed)
Family Medicine Teaching [facility] Discharge Summary  Patient name: Vicki Rogers Medical record number: [id number] Date of birth: 02/24/1946 Age: 72 y.o. Gender: female Date of Admission: 04/07/2018  Date of Discharge: 04/09/2018 Admitting Physician: Doreene Eland, MD  Primary Care Provider: Jackie Plum, MD Consultants: Cardiology, Vascular Surgery (by phone)  Indication for Hospitalization: Chest Pain  Discharge Diagnoses/Problem List:  Chest and abdominal pain Anemia T2DM HTN HLD Hx CVA in 1995 Hx diverticulosis  GERD  Disposition: Home  Discharge Condition: stable  Discharge Exam:  General: 72 y.o. female in NAD Cardio: RRR, no m/r/g Lungs: CTAB, no wheezing, no rhonchi, no crackles Abdomen: Soft, non-tender to palpation, positive bowel sounds Skin: warm and dry Extremities: No edema   Brief Hospital Course:  Vicki Rogers a 72 y.o.femalepresenting with chest and abdominal pain. PMH is significant forDM-II, HTN, HLD, GERD, h/o CVA, h/o of diverticulosis.  Her hospital course is outlined below.  Vicki Rogers presented with chest pain and found to have elevated troponin with EKG changes.  She was started on heparin drip and cardiology was consulted. The following morning she was taken for catheterization revealing 90% stenosed proximal to mid LAD, requiring PCI with drug eluding stent.  Chest pain resolved and did not recur throughout admission.  Echo performed on 9/30 showed normal LV function without regional wall motion abnormalities and mild to moderate aortic stenosis.  She was cleared for discharge by cardiology on DAPT for 12 months, atorvastatin, metoprolol, and was transitioned from amlodipine to lisinopril, with nitro prn.  She will also continue cardiac rehab as an outpatient.    Patient also noted some weakness during admission and for this weekend was evaluated by physical therapy.  Following their recommendation, she will be requiring home health  PT.  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  Vascular Surgery was consulted by phone and recommended follow up in 6 months with ultrasound.  Other chronic problems were addressed and managed throughout hospitalization without changes to outpatient therapies.  Issues for Follow Up:  1. 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 Check 1 week post-discharge following starting Lisinopril 3. Titrate Lisinopril as needed for BP 4. Cont ASA life-long, following discontinuation of DAPT in 12 months post-stent 5. Follow up with cardiology in 1 month 6. Should follow with cardiac rehab 7. Repeat echo in 2-3 years for follow up of aortic stenosis. 8. Started on atorvastatin.  Will need lipid profile and LFTs in 6-8 weeks.  Significant Procedures:  9/30 Cardiac Cath, PCI with DES of LAD  Significant Labs and Imaging:  Recent Labs  Lab 04/07/18 1610 04/07/18 1623 04/08/18 0416 04/09/18 0333  WBC 8.8  --  13.8* 7.9  HGB 13.7 14.6 12.8 11.4*  HCT 42.0 43.0 39.5 34.8*  PLT 273  --  329 262   Recent Labs  Lab 04/07/18 1610 04/07/18 1623 04/08/18 0416 04/09/18 0333  NA 138 141 140 138  K 3.8 3.8 3.4* 3.7  CL 104 102 105 110  CO2 25  --  24 24  GLUCOSE 165* 167* 51* 124*  BUN 15 17 18 16   CREATININE 0.93 0.90 1.10* 1.06*  CALCIUM 9.6  --  9.7 8.7*  ALKPHOS 119  --   --   --   AST 22  --   --   --   ALT 16  --   --   --  ALBUMIN 3.7  --   --   --     Cardiac catheterization 04/08/18:   Prox RCA to Mid RCA lesion is 30% stenosed.  Mid RCA lesion is 50% stenosed.  Mid LM to Dist LM lesion is 40% stenosed.  Prox LAD to Mid LAD lesion is 90% stenosed.  Ost 1st Diag to 1st Diag lesion is 40% stenosed.  A drug-eluting stent was successfully placed using a STENT ORSIRO 3.0X22.  Post intervention, there is a 0% residual stenosis.  1.  Severe mid-LAD stenosis, treated successfully with a 3.0x22 mm Orsiro DES 2. Nonobstructive left main, LCx, and RCA stenosis, appropriate for aggressive medical therapy  Recommend uninterrupted dual antiplatelet therapy with Aspirin 81mg  daily and Ticagrelor 90mg  twice dailyfor a minimum of 12 months (ACS - Class I recommendation).  Echocardiogram results:  Study Conclusions  - Left ventricle: The cavity size was normal. Wall thickness was normal. Systolic function was normal. The estimated ejection fraction was in the range of 60% to 65%. Wall motion was normal; there were no regional wall motion abnormalities. Features are consistent with a pseudonormal left ventricular filling pattern, with concomitant abnormal relaxation and increased filling pressure (grade 2 diastolic dysfunction). - Aortic valve: Mildly to moderately calcified annulus. Moderately thickened, moderately calcified leaflets. There was mild to moderate stenosis. Valve area (VTI): 1.01 cm^2. Valve area (Vmax): 0.92 cm^2. Valve area (Vmean): 0.91 cm^2. - Mitral valve: Mildly calcified annulus. Mildly thickened, mildly calcified leaflets .  Dg Chest Port 1 View  Result Date: 04/07/2018 CLINICAL DATA:  Chest pain EXAM: PORTABLE CHEST 1 VIEW COMPARISON:  December 28, 2017 FINDINGS: There is mild interstitial thickening, stable. There is no frank edema or consolidation. Heart is borderline enlarged with pulmonary vascularity normal. No adenopathy. There is aortic atherosclerosis. There is bilateral carotid artery calcification. No bone lesions. IMPRESSION: Mild interstitial thickening without frank edema or consolidation. Mild cardiac enlargement. No adenopathy evident. There is aortic atherosclerosis as well as foci of calcification in both carotid arteries. Aortic Atherosclerosis (ICD10-I70.0). Electronically Signed   By: Bretta Bang III M.D.   On: 04/07/2018 15:47   Ct Angio Chest/abd/pel For  Dissection W And/or W/wo  Result Date: 04/07/2018 CLINICAL DATA:  Chest and abdominal pain. EXAM: CT ANGIOGRAPHY CHEST, ABDOMEN AND PELVIS TECHNIQUE: Multidetector CT imaging through the chest, abdomen and pelvis was performed using the standard protocol during bolus administration of intravenous contrast. Multiplanar reconstructed images and MIPs were obtained and reviewed to evaluate the vascular anatomy. CONTRAST:  ISOVUE-370 IOPAMIDOL (ISOVUE-370) INJECTION 76% COMPARISON:  Radiograph of same day. FINDINGS: CTA CHEST FINDINGS Cardiovascular: Preferential opacification of the thoracic aorta. No evidence of thoracic aortic aneurysm or dissection. Normal heart size. No pericardial effusion. Mediastinum/Nodes: No enlarged mediastinal, hilar, or axillary lymph nodes. Thyroid gland, trachea, and esophagus demonstrate no significant findings. Lungs/Pleura: Lungs are clear. No pleural effusion or pneumothorax. Musculoskeletal: Moderate thoracic kyphosis is noted. No acute abnormality is noted. Review of the MIP images confirms the above findings. CTA ABDOMEN AND PELVIS FINDINGS VASCULAR Aorta: Atherosclerosis of abdominal aorta is noted without aneurysm formation. 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 Celiac: Patent without evidence of aneurysm, dissection, vasculitis or significant stenosis. SMA: Patent without evidence of aneurysm, dissection, vasculitis or significant stenosis. Renals: Both renal arteries are patent without evidence of aneurysm, dissection, vasculitis, fibromuscular dysplasia or significant stenosis. IMA: Patent without evidence of aneurysm, dissection, vasculitis or significant stenosis. Inflow: 1.9 cm left common iliac  artery aneurysm is noted. No significant stenosis or other abnormality seen involving the iliac arteries. Veins: No obvious venous abnormality within the limitations of this arterial phase study. Review of  the MIP images confirms the above findings. NON-VASCULAR Hepatobiliary: No focal liver abnormality is seen. No gallstones, gallbladder wall thickening, or biliary dilatation. Pancreas: Unremarkable. No pancreatic ductal dilatation or surrounding inflammatory changes. Spleen: Normal in size without focal abnormality. Adrenals/Urinary Tract: Adrenal glands are unremarkable. Kidneys are normal, without renal calculi, focal lesion, or hydronephrosis. Bladder is unremarkable. Stomach/Bowel: Stomach is within normal limits. Appendix appears normal. No evidence of bowel wall thickening, distention, or inflammatory changes. Lymphatic: No significant adenopathy is noted. Reproductive: Uterus and bilateral adnexa are unremarkable. Other: No abdominal wall hernia or abnormality. No abdominopelvic ascites. Musculoskeletal: No acute or significant osseous findings. Review of the MIP images confirms the above findings. IMPRESSION: No evidence of thoracic or abdominal aortic aneurysm or dissection. 16 x 14 mm penetrating atherosclerotic ulcer is seen arising anteriorly from infrarenal abdominal aorta. 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 No evidence of significant mesenteric or renal artery stenosis. Aortic Atherosclerosis (ICD10-I70.0). Electronically Signed   By: Lupita Raider, M.D.   On: 04/07/2018 21:32   Results/Tests Pending at Time of Discharge: None  Discharge Medications:  Allergies as of 04/09/2018      Reactions   Penicillins Anaphylaxis   Has patient had a PCN reaction causing immediate rash, facial/tongue/throat swelling, SOB or lightheadedness with hypotension: YES Has patient had a PCN reaction causing severe rash involving mucus membranes or skin necrosis:NO Has patient had a PCN reaction that required hospitalization YES Has patient had a PCN reaction occurring within the last 10 years: NO If all of the above answers are "NO", then may proceed with Cephalosporin use.   Shrimp [shellfish  Allergy] Anaphylaxis   Metformin And Related Nausea And Vomiting   Pt trialed Metformin 500 mg BID 09/2015, 2X hx vomiting; unwilling to retry.      Medication List    STOP taking these medications   amLODipine 5 MG tablet Commonly known as:  NORVASC     TAKE these medications   aspirin 81 MG EC tablet Take 1 tablet (81 mg total) by mouth daily. Start taking on:  04/10/2018   atorvastatin 80 MG tablet Commonly known as:  LIPITOR Take 1 tablet (80 mg total) by mouth daily at 6 PM.   glipiZIDE 5 MG 24 hr tablet Commonly known as:  GLUCOTROL XL Take 5 mg by mouth daily with breakfast.   lisinopril 5 MG tablet Commonly known as:  PRINIVIL,ZESTRIL Take 1 tablet (5 mg total) by mouth daily.   metoprolol tartrate 25 MG tablet Commonly known as:  LOPRESSOR Take 1 tablet (25 mg total) by mouth 2 (two) times daily.   nitroGLYCERIN 0.4 MG SL tablet Commonly known as:  NITROSTAT Place 1 tablet (0.4 mg total) under the tongue every 5 (five) minutes as needed for chest pain.   OMEPRAZOLE PO Take 1 capsule by mouth daily as needed (heartburn).   OPTIVE 0.5-0.9 % ophthalmic solution Generic drug:  carboxymethylcellul-glycerin Place 1 drop into both eyes as needed for dry eyes.   ticagrelor 90 MG Tabs tablet Commonly known as:  BRILINTA Take 1 tablet (90 mg total) by mouth 2 (two) times daily.       Discharge Instructions: Please refer to Patient Instructions section of EMR for full details.  Patient was counseled important signs and symptoms that should prompt return  to medical care, changes in medications, dietary instructions, activity restrictions, and follow up appointments.   Follow-Up Appointments: Follow-up Information    [facility] Follow up.   Specialty:  Home Health Services Why:  Nurse, physical therapist and aide to follow up with you at home Contact information: [address] [place] [address] [phone]        Berton Bon, NP Follow up on 05/14/2018.   Specialty:  Nurse Practitioner Why:  Your follow up appointment will be on 05/14/18 at 0930am.  Contact information: [address] [place] [address] [phone]        Jackie Plum, MD. Call.   Specialty:  Internal Medicine Why:  ASAP to make appointment for follow up in 1 week Contact information: [address] [place] [address] [phone]           Unknown Jim, DO 04/09/2018, 9:22 PM PGY-1, [facility]

## 2018-04-09 NOTE — Progress Notes (Addendum)
Progress Note  Patient Name: Vicki Rogers Date of Encounter: 04/09/2018  Primary Cardiologist: Dr. Allred/Dr. Corky Mull   Subjective   Pt feeling well today. No chest pain, palpitations or SOB overnight. Cath site tender but no s/s of hematoma.   Inpatient Medications    Scheduled Meds: . amLODipine  5 mg Oral Daily  . aspirin EC  81 mg Oral Daily  . atorvastatin  80 mg Oral q1800  . insulin aspart  0-9 Units Subcutaneous TID WC  . metoprolol tartrate  25 mg Oral BID  . sodium chloride flush  3 mL Intravenous Q12H  . ticagrelor  90 mg Oral BID   Continuous Infusions: . sodium chloride     PRN Meds: sodium chloride, acetaminophen **OR** acetaminophen, morphine injection, ondansetron (ZOFRAN) IV, oxyCODONE, senna-docusate, sodium chloride flush   Vital Signs    Vitals:   04/09/18 0024 04/09/18 0232 04/09/18 0400 04/09/18 0735  BP: (!) 98/46 (!) 103/57 (!) 107/59 118/73  Pulse: 73 78 73 89  Resp: 19 (!) 21 20 19   Temp:   98.2 F (36.8 C) 98.6 F (37 C)  TempSrc:   Oral Oral  SpO2: 95% 95% 97% 99%  Weight:      Height:        Intake/Output Summary (Last 24 hours) at 04/09/2018 0757 Last data filed at 04/09/2018 0600 Gross per 24 hour  Intake 549.18 ml  Output 450 ml  Net 99.18 ml   Filed Weights   04/07/18 1515 04/07/18 2303  Weight: 109.8 kg 94.5 kg    Physical Exam   General: Obese, NAD Skin: Warm, dry, intact  Head: Normocephalic, atraumatic, clear, moist mucus membranes. Neck: Negative for carotid bruits. No JVD Lungs:Clear to ausculation bilaterally. No wheezes, rales, or rhonchi. Breathing is unlabored. Cardiovascular: RRR with S1 S2. No murmurs, rubs, gallops, or LV heave appreciated. Abdomen: Soft, non-tender, non-distended with normoactive bowel sounds. No obvious abdominal masses. MSK: Strength and tone appear normal for age. 5/5 in all extremities Extremities: No edema. No clubbing or cyanosis. DP/PT pulses 2+ bilaterally Neuro: Alert  and oriented. No focal deficits. No facial asymmetry. MAE spontaneously. Psych: Responds to questions appropriately with normal affect.    Labs    Chemistry Recent Labs  Lab 04/07/18 1610 04/07/18 1623 04/08/18 0416 04/09/18 0333  NA 138 141 140 138  K 3.8 3.8 3.4* 3.7  CL 104 102 105 110  CO2 25  --  24 24  GLUCOSE 165* 167* 51* 124*  BUN 15 17 18 16   CREATININE 0.93 0.90 1.10* 1.06*  CALCIUM 9.6  --  9.7 8.7*  PROT 8.0  --   --   --   ALBUMIN 3.7  --   --   --   AST 22  --   --   --   ALT 16  --   --   --   ALKPHOS 119  --   --   --   BILITOT 0.5  --   --   --   GFRNONAA >60  --  49* 52*  GFRAA >60  --  57* 60*  ANIONGAP 9  --  11 4*     Hematology Recent Labs  Lab 04/07/18 1610 04/07/18 1623 04/08/18 0416 04/09/18 0333  WBC 8.8  --  13.8* 7.9  RBC 5.06  --  4.72 4.15  HGB 13.7 14.6 12.8 11.4*  HCT 42.0 43.0 39.5 34.8*  MCV 83.0  --  83.7 83.9  MCH  27.1  --  27.1 27.5  MCHC 32.6  --  32.4 32.8  RDW 14.7  --  15.2 15.0  PLT 273  --  329 262    Cardiac Enzymes Recent Labs  Lab 04/07/18 1900 04/07/18 2321 04/08/18 0416 04/08/18 1257  TROPONINI 1.13* 6.18* 8.79* 6.99*    Recent Labs  Lab 04/07/18 1622  TROPIPOC 0.36*     BNPNo results for input(s): BNP, PROBNP in the last 168 hours.   DDimer  Recent Labs  Lab 04/07/18 1610  DDIMER 0.73*     Radiology    Dg Chest Port 1 View  Result Date: 04/07/2018 CLINICAL DATA:  Chest pain EXAM: PORTABLE CHEST 1 VIEW COMPARISON:  December 28, 2017 FINDINGS: There is mild interstitial thickening, stable. There is no frank edema or consolidation. Heart is borderline enlarged with pulmonary vascularity normal. No adenopathy. There is aortic atherosclerosis. There is bilateral carotid artery calcification. No bone lesions. IMPRESSION: Mild interstitial thickening without frank edema or consolidation. Mild cardiac enlargement. No adenopathy evident. There is aortic atherosclerosis as well as foci of calcification in  both carotid arteries. Aortic Atherosclerosis (ICD10-I70.0). Electronically Signed   By: Bretta Bang III M.D.   On: 04/07/2018 15:47   Ct Angio Chest/abd/pel For Dissection W And/or W/wo  Result Date: 04/07/2018 CLINICAL DATA:  Chest and abdominal pain. EXAM: CT ANGIOGRAPHY CHEST, ABDOMEN AND PELVIS TECHNIQUE: Multidetector CT imaging through the chest, abdomen and pelvis was performed using the standard protocol during bolus administration of intravenous contrast. Multiplanar reconstructed images and MIPs were obtained and reviewed to evaluate the vascular anatomy. CONTRAST:  ISOVUE-370 IOPAMIDOL (ISOVUE-370) INJECTION 76% COMPARISON:  Radiograph of same day. FINDINGS: CTA CHEST FINDINGS Cardiovascular: Preferential opacification of the thoracic aorta. No evidence of thoracic aortic aneurysm or dissection. Normal heart size. No pericardial effusion. Mediastinum/Nodes: No enlarged mediastinal, hilar, or axillary lymph nodes. Thyroid gland, trachea, and esophagus demonstrate no significant findings. Lungs/Pleura: Lungs are clear. No pleural effusion or pneumothorax. Musculoskeletal: Moderate thoracic kyphosis is noted. No acute abnormality is noted. Review of the MIP images confirms the above findings. CTA ABDOMEN AND PELVIS FINDINGS VASCULAR Aorta: Atherosclerosis of abdominal aorta is noted without aneurysm formation. 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 Celiac: Patent without evidence of aneurysm, dissection, vasculitis or significant stenosis. SMA: Patent without evidence of aneurysm, dissection, vasculitis or significant stenosis. Renals: Both renal arteries are patent without evidence of aneurysm, dissection, vasculitis, fibromuscular dysplasia or significant stenosis. IMA: Patent without evidence of aneurysm, dissection, vasculitis or significant stenosis. 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 No  significant stenosis or other abnormality seen involving the iliac arteries. Veins: No obvious venous abnormality within the limitations of this arterial phase study. Review of the MIP images confirms the above findings. NON-VASCULAR Hepatobiliary: No focal liver abnormality is seen. No gallstones, gallbladder wall thickening, or biliary dilatation. Pancreas: Unremarkable. No pancreatic ductal dilatation or surrounding inflammatory changes. Spleen: Normal in size without focal abnormality. Adrenals/Urinary Tract: Adrenal glands are unremarkable. Kidneys are normal, without renal calculi, focal lesion, or hydronephrosis. Bladder is unremarkable. Stomach/Bowel: Stomach is within normal limits. Appendix appears normal. No evidence of bowel wall thickening, distention, or inflammatory changes. Lymphatic: No significant adenopathy is noted. Reproductive: Uterus and bilateral adnexa are unremarkable. Other: No abdominal wall hernia or abnormality. No abdominopelvic ascites. Musculoskeletal: No acute or significant osseous findings. Review of the MIP images confirms the above findings. IMPRESSION: No evidence of thoracic  or abdominal aortic aneurysm or dissection. 16 x 14 mm penetrating atherosclerotic ulcer is seen arising anteriorly from infrarenal abdominal aorta. 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 No evidence of significant mesenteric or renal artery stenosis. Aortic Atherosclerosis (ICD10-I70.0). Electronically Signed   By: Lupita Raider, M.D.   On: 04/07/2018 21:32    Telemetry    04/09/18 NSR HR 90's  - Personally Reviewed  ECG    No new tracing as of 04/09/18 - Personally Reviewed  Cardiac Studies   Cardiac catheterization 04/08/18:   Prox RCA to Mid RCA lesion is 30% stenosed.  Mid RCA lesion is 50% stenosed.  Mid LM to Dist LM lesion is 40% stenosed.  Prox LAD to Mid LAD lesion is 90% stenosed.  Ost 1st Diag to 1st Diag lesion is 40% stenosed.  A drug-eluting stent was  successfully placed using a STENT ORSIRO 3.0X22.  Post intervention, there is a 0% residual stenosis.   1. Severe mid-LAD stenosis, treated successfully with a 3.0x22 mm Orsiro DES 2. Nonobstructive left main, LCx, and RCA stenosis, appropriate for aggressive medical therapy  Recommend uninterrupted dual antiplatelet therapy with Aspirin 81mg  daily and Ticagrelor 90mg  twice daily for a minimum of 12 months (ACS - Class I recommendation).   Echocardiogram 04/09/18: Pending   Patient Profile     72 y.o. female with past medical history of diabetes mellitus type 2, diverticulosis, hypertension and stroke presented for epigastric pain.  Developed elevated troponin and EKG changes.  Assessment & Plan    1. NSTEMI: -Pt underwent successful PCI/DES to mLAD on 04/08/18 with plans for uninterrupted DAPT with ASA n81 and Ticagrelor 90mg  twice daily for a minimum of 12 months per cath note>>educated on importance of medication compliance  -Echo, pending -Trop, downtrending>>8.79>6.99 with no CP recurrence overnight -Cath site unremarkable  -Denies chest pain -Will need cardiac rehab prior to DC -Continue ASA, Brilinta, metoprolol, statin   2. HLD: -Elevated, LDL 171 with goal <70 -Hight intensity statin initiated  -Will need lipid profile and LFT's in 6-8 weeks   3. HTN: -Stable, 118/73>107/59>103/57 -Continue metoprolol  4. DM2: -Hb A1c, 6.4 with goal of <7.0 -SSI for glucose control while inpatient status    Signed, Georgie Chard NP-C [facility] Pager: [phone] 04/09/2018, 7:57 AM    --------------------------------------------------------------------------------------   History and all data above reviewed.  Patient examined.  I agree with the findings as above.  Vicki Rogers is doing well today and appears to be in brighter spirits.  She states that occasionally she feels troubled by her current medical state.  She underwent coronary angiography with PCI to the mid LAD  yesterday.   GEN: no acute distress Neck:No apparent JVD, however limited assessment.  Cardiac:regular rhythm, normal rate. 3/6 systolic ejection murmur heard best of RUSB, late peaking but preserved S2.  Right radial access site is bandaged, tender without hematoma or bleeding.  Dressing is clean, dry, and intact  Respiratory:shallow breath sounds, no apparent crackles or wheezes.   GN:FAOZ, nontender, non-distended.   MS:No edema; No deformity. Neuro:Nonfocal  Psych: Normal affect   All available labs, radiology testing, previous records reviewed. Agree with documented assessment and plan of my colleague as stated above with the following additions or changes:  Principal Problem:   Non-ST elevation (NSTEMI) myocardial infarction [facility]) Active Problems:   Chest pain   Abdominal pain, epigastric   Hyperlipidemia   Penetrating ulcer of aorta (HCC)   Type 2 diabetes mellitus with complication, without long-term  current use of insulin (HCC)   Aortic stenosis    Plan: - Vicki Rogers will go for an echocardiogram today.  She has no clinical features of heart failure, and I anticipate that her ejection fraction will be preserved.  She does have a systolic ejection murmur.  On catheterization yesterday, her peak to peak gradient was not consistent with severe aortic stenosis, we will assess this on echocardiography.  We will also assess for regional wall motion abnormalities particularly in the LAD distribution.  -We will plan for goal-directed medical therapy status post and STEMI which will include ticagrelor twice daily and aspirin 81 mg daily for 12 months.  I have instructed the patient to continue this medication and not to stop without consulting a cardiologist.  I have stressed to her the importance of dual antiplatelet therapy and compliance with medication so as to avoid stent thrombosis.  The remainder of her goal-directed medical therapy will include sublingual nitroglycerin to  be taken as needed for chest pain with instructions for presentation to the emergency department for continued chest pain.  She will also receive metoprolol tartrate twice daily, and atorvastatin 80 mg daily for secondary prevention.  With her history of diabetes, I would suggest transitioning her antihypertensive therapy from amlodipine to lisinopril.  She should be counseled on side effect of cough which can be associated with initiation of lisinopril.  This can be initiated at a low dose and titrated upward as an outpatient.  - I have encouraged Vicki Rogers to participate in phase 1 cardiac rehab as an interval part of her post myocardial infarction therapy.  Barriers to participating in cardiac rehab include the fact that the patient does not drive and does not have a ride 3 times a week to cardiac rehab.  Parke Poisson, MD [facility] 10:19 AM  04/09/2018   ADDENDUM: Echocardiogram results:  Study Conclusions  - Left ventricle: The cavity size was normal. Wall thickness was   normal. Systolic function was normal. The estimated ejection   fraction was in the range of 60% to 65%. Wall motion was normal;   there were no regional wall motion abnormalities. Features are   consistent with a pseudonormal left ventricular filling pattern,   with concomitant abnormal relaxation and increased filling   pressure (grade 2 diastolic dysfunction). - Aortic valve: Mildly to moderately calcified annulus. Moderately   thickened, moderately calcified leaflets. There was mild to   moderate stenosis. Valve area (VTI): 1.01 cm^2. Valve area   (Vmax): 0.92 cm^2. Valve area (Vmean): 0.91 cm^2. - Mitral valve: Mildly calcified annulus. Mildly thickened, mildly   calcified leaflets .  The echocardiogram reveals normal LV function without regional wall motion abnormalities. Mild to moderate aortic stenosis, can be reevaluated by echo in 2-3 years for progression. No other significant concerns post  echo.  [facility] will sign off.   Medication Recommendations:  - ticagrelor 90mg  BID and aspirin 81 mg daily for 12 MONTHS. Aspirin to be continued life-long. - atorvastatin 80 mg daily - metoprolol tartrate 25 mg BID, dose as tolerated - transition amlodipine to lisinopril, can start with 5 mg daily and titrate as an outpatient. Patient should be counseled on cough that can be associated with ACE-I. Renal function should be checked in 1 week post hospital dismissal with primary care follow up to review. - Sublingual nitroglycerin prn prescription for chest pain  Other recommendations (labs, testing, etc): --  Follow up as an outpatient:  Follow up with Cardiology in  1 month, can follow up with Dr. Johney Frame, Dr. Excell Seltzer, or myself. For questions or updates, please contact   Please consult [email or web address] for contact info under Cardiology/STEMI.

## 2018-04-09 NOTE — Evaluation (Signed)
Physical Therapy Evaluation Patient Details Name: Vicki Rogers MRN: [id number] DOB: Jun 21, 1946 Today's Date: 04/09/2018   History of Present Illness  Pt adm with chest pain and found to have NSTEMI. Underwent cardiac cath with stent on 9/30.  PMH - DM-II, HTN, HLD, GERD, h/o CVA  Clinical Impression  Pt doing well with mobility. Functional activity tolerance not at pt's baseline and recommend HHPT to address this.     Follow Up Recommendations Home health PT    Equipment Recommendations  None recommended by PT    Recommendations for Other Services       Precautions / Restrictions Precautions Precautions: None      Mobility  Bed Mobility Overal bed mobility: Modified Independent                Transfers Overall transfer level: Modified independent Equipment used: 4-wheeled walker;None             General transfer comment: Stood from bed and toilet  Ambulation/Gait Ambulation/Gait assistance: Modified independent (Device/Increase time) Gait Distance (Feet): 300 Feet Assistive device: 4-wheeled walker Gait Pattern/deviations: Step-through pattern;Decreased stride length Gait velocity: decr Gait velocity interpretation: 1.31 - 2.62 ft/sec, indicative of limited community ambulator General Gait Details: Pt with steady gait using rollator. 1 standing rest break  Stairs            Wheelchair Mobility    Modified Rankin (Stroke Patients Only)       Balance Overall balance assessment: Mild deficits observed, not formally tested                                           Pertinent Vitals/Pain Pain Assessment: No/denies pain    Home Living Family/patient expects to be discharged to:: Private residence Living Arrangements: Alone Available Help at Discharge: Family;Available PRN/intermittently Type of Home: Apartment Home Access: Level entry     Home Layout: One level Home Equipment: Walker - 4 wheels;Cane - single point       Prior Function Level of Independence: Independent with assistive device(s)         Comments: Uses rollator or walker at times     Hand Dominance        Extremity/Trunk Assessment   Upper Extremity Assessment Upper Extremity Assessment: Overall WFL for tasks assessed    Lower Extremity Assessment Lower Extremity Assessment: Overall WFL for tasks assessed       Communication   Communication: No difficulties  Cognition Arousal/Alertness: Awake/alert Behavior During Therapy: WFL for tasks assessed/performed Overall Cognitive Status: Within Functional Limits for tasks assessed                                        General Comments      Exercises     Assessment/Plan    PT Assessment All further PT needs can be met in the next venue of care  PT Problem List Decreased mobility       PT Treatment Interventions      PT Goals (Current goals can be found in the Care Plan section)  Acute Rehab PT Goals Patient Stated Goal: return home PT Goal Formulation: All assessment and education complete, DC therapy    Frequency     Barriers to discharge        Co-evaluation  AM-PAC PT "6 Clicks" Daily Activity  Outcome Measure Difficulty turning over in bed (including adjusting bedclothes, sheets and blankets)?: None Difficulty moving from lying on back to sitting on the side of the bed? : None Difficulty sitting down on and standing up from a chair with arms (e.g., wheelchair, bedside commode, etc,.)?: None Help needed moving to and from a bed to chair (including a wheelchair)?: None Help needed walking in hospital room?: None Help needed climbing 3-5 steps with a railing? : A Little 6 Click Score: 23    End of Session   Activity Tolerance: Patient tolerated treatment well Patient left: in bed;with call bell/phone within reach Nurse Communication: Mobility status PT Visit Diagnosis: Difficulty in walking, not elsewhere  classified (R26.2)    Time: 2258-3462 PT Time Calculation (min) (ACUTE ONLY): 20 min   Charges:   PT Evaluation $PT Eval Low Complexity: [place] Pager [phone] Office [place] 04/09/2018, 3:26 PM

## 2018-04-09 NOTE — Discharge Instructions (Signed)
Coronary Angiogram With Stent, Care After °This sheet gives you information about how to care for yourself after your procedure. Your health care provider may also give you more specific instructions. If you have problems or questions, contact your health care provider. °What can I expect after the procedure? °After your procedure, it is common to have: °· Bruising in the area where a small, thin tube (catheter) was inserted. This usually fades within 1-2 weeks. °· Blood collecting in the tissue (hematoma) that may be painful to the touch. It should usually decrease in size and tenderness within 1-2 weeks. ° °Follow these instructions at home: °Insertion area care °· Do not take baths, swim, or use a hot tub until your health care provider approves. °· You may shower 24-48 hours after the procedure or as directed by your health care provider. °· Follow instructions from your health care provider about how to take care of your incision. Make sure you: °? Wash your hands with soap and water before you change your bandage (dressing). If soap and water are not available, use hand sanitizer. °? Change your dressing as told by your health care provider. °? Leave stitches (sutures), skin glue, or adhesive strips in place. These skin closures may need to stay in place for 2 weeks or longer. If adhesive strip edges start to loosen and curl up, you may trim the loose edges. Do not remove adhesive strips completely unless your health care provider tells you to do that. °· Remove the bandage (dressing) and gently wash the catheter insertion site with plain soap and water. °· Pat the area dry with a clean towel. Do not rub the area, because that may cause bleeding. °· Do not apply powder or lotion to the incision area. °· Check your incision area every day for signs of infection. Check for: °? More redness, swelling, or pain. °? More fluid or blood. °? Warmth. °? Pus or a bad smell. °Activity °· Do not drive for 24 hours if you  were given a medicine to help you relax (sedative). °· Do not lift anything that is heavier than 10 lb (4.5 kg) for 5 days after your procedure or as directed by your health care provider. °· Ask your health care provider when it is okay for you: °? To return to work or school. °? To resume usual physical activities or sports. °? To resume sexual activity. °Eating and drinking °· Eat a heart-healthy diet. This should include plenty of fresh fruits and vegetables. °· Avoid the following types of food: °? Food that is high in salt. °? Canned or highly processed food. °? Food that is high in saturated fat or sugar. °? Fried food. °· Limit alcohol intake to no more than 1 drink a day for non-pregnant women and 2 drinks a day for men. One drink equals 12 oz of beer, 5 oz of wine, or 1½ oz of hard liquor. °Lifestyle °· Do not use any products that contain nicotine or tobacco, such as cigarettes and e-cigarettes. If you need help quitting, ask your health care provider. °· Take steps to manage and control your weight. °· Get regular exercise. °· Manage your blood pressure. °· Manage other health problems, such as diabetes. °General instructions °· Take over-the-counter and prescription medicines only as told by your health care provider. Blood thinners may be prescribed after your procedure to improve blood flow through the stent. °· If you need an MRI after your heart stent has been placed, be   sure to tell the health care provider who orders the MRI that you have a heart stent.  Keep all follow-up visits as directed by your health care provider. This is important. Contact a health care provider if:  You have a fever.  You have chills.  You have increased bleeding from the catheter insertion area. Hold pressure on the area. Get help right away if:  You develop chest pain or shortness of breath.  You feel faint or you pass out.  You have unusual pain at the catheter insertion area.  You have redness,  warmth, or swelling at the catheter insertion area.  You have drainage (other than a small amount of blood on the dressing) from the catheter insertion area.  The catheter insertion area is bleeding, and the bleeding does not stop after 30 minutes of holding steady pressure on the area.  You develop bleeding from any other place, such as from your rectum. There may be bright red blood in your urine or stool, or it may appear as black, tarry stool. This information is not intended to replace advice given to you by your health care provider. Make sure you discuss any questions you have with your health care provider. Document Released: 01/13/2005 Document Revised: 03/23/2016 Document Reviewed: 03/23/2016 Elsevier Interactive Patient Education  2018 [employer]   Acute Coronary Syndrome Acute coronary syndrome (ACS) is a serious problem in which there is suddenly not enough blood and oxygen reaching the heart. ACS can result in chest pain or a heart attack. What are the causes? This condition may be caused by:  A buildup of fat and cholesterol inside of the arteries (atherosclerosis). This is the most common cause. The buildup (plaque) can cause the blood vessels in your heart (coronary arteries) to become narrow or blocked. Plaque can also break off to form a clot.  A coronary spasm.  A tearing of the coronary artery (spontaneous coronary artery dissection).  Low blood pressure (hypotension).  An abnormal heart beat (arrhythmia).  Using cocaine or methamphetamine.  What increases the risk? The following factors may make you more likely to develop this condition:  Age.  History of chest pain, heart attack, or stroke.  Being female.  Family history of chest pain, heart disease, or stroke.  Smoking.  Inactivity.  Being overweight.  High cholesterol.  High blood pressure (hypertension).  Diabetes.  Excessive alcohol use.  What are the signs or symptoms? Common  symptoms of this condition include:  Chest pain. The pain may last long, or may stop and come back (recur). It may feel like: ? Crushing or squeezing. ? Tightness, pressure, fullness, or heaviness.  Arm, neck, jaw, or back pain.  Heartburn or indigestion.  Shortness of breath.  Nausea.  Sudden cold sweats.  Lightheadedness.  Dizziness.  Tiredness (fatigue).  Sometimes there are no symptoms. How is this diagnosed? This condition may be diagnosed through:  An electrocardiogram (ECG). This test records the impulses of the heart.  Blood tests.  A CT scan of the chest.  A coronary angiogram. This procedure checks for a blockage in the coronary arteries.  How is this treated? Treatment for this condition may include:  Oxygen.  Medicines, such as: ? Antiplatelet medicines and blood-thinning medicines, such as aspirin. These help prevent blood clots. ? Fibrinolytic therapy. This breaks apart a blood clot. ? Blood pressure medicines. ? Nitroglycerin. ? Pain medicine. ? Cholesterol medicine.  A procedure called coronary angioplasty and stenting. This is done to widen a  narrowed artery and keep it open.  Coronary artery bypass surgery. This allows blood to pass the blockage to reach your heart.  Cardiac rehabilitation. This is a program that helps improve your health and well-being. It includes exercise training, education, and counseling to help you recover.  Follow these instructions at home: Eating and drinking  Follow a heart-healthy, low-salt (sodium) diet.  Use healthy cooking methods such as roasting, grilling, broiling, baking, poaching, steaming, or stir-frying.  Talk to a dietitian to learn about healthy cooking methods and how to eat less sodium. Medicines  Take over-the-counter and prescription medicines only as told by your health care provider.  Do not take these medicines unless your health care provider approves: ? Nonsteroidal anti-inflammatory  drugs (NSAIDs), such as ibuprofen, naproxen, or celecoxib. ? Vitamin supplements that contain vitamin A or vitamin E. ? Hormone replacement therapy that contains estrogen. Activity  Join a cardiac rehabilitation program.  Ask your health care provider: ? What activities and exercises are safe for you. ? If you should follow specific instructions about lifting, driving, or climbing stairs.  If you are taking aspirin and another blood thinning medicine, avoid activities that are likely to result in an injury. The medicines can increase your risk of bleeding. Lifestyle  Do not use any products that contain nicotine or tobacco, such as cigarettes and e-cigarettes. If you need help quitting, ask your health care provider.  If you drink alcohol and your health care provider says it is okay to drink, limit your alcohol intake to no more than 1 drink per day. One drink equals 12 oz of beer, 5 oz of wine, or 1 oz of hard liquor.  Maintain a healthy weight. If you need to lose weight, do it in a way that has been approved by your health care provider. General instructions  Tell all your health care providers about your heart condition, including your dentist. Some medicines can increase your risk of arrhythmia.  Manage other health conditions, such as hypertension and diabetes. These conditions affect your heart.  Learn ways to manage stress.  Get screened for depression, and seek treatment if needed.  Monitor your blood pressure if told by your health care provider.  Keep your vaccinations up to date. Get the annual influenza vaccine.  Keep all follow-up visits as told by your health care provider. This is important. Contact a health care provider if:  You feel overwhelmed or sad.  You have trouble with your daily activities. Get help right away if:  You have pain in your chest, neck, arm, jaw, stomach, or back that recurs, and: ? Lasts more than a few minutes. ? Is not relieved by  taking the medicineyour health care provider prescribed.  You have unexplained: ? Heavy sweating. ? Heartburn or indigestion. ? Shortness of breath. ? Difficulty breathing. ? Nausea or vomiting. ? Fatigue. ? Nervousness or anxiety. ? Weakness. ? Diarrhea. ? Dark stools or blood in the stool.  You have sudden lightheadedness or dizziness.  Your blood pressure is higher than 180/120  You faint.  You feel like hurting yourself or think about taking your own life. These symptoms may represent a serious problem that is an emergency. Do not wait to see if the symptoms will go away. Get medical help right away. Call your local emergency services (911 in the [place] Do not drive yourself to the clinic or hospital. Summary  Acute coronary syndrome (ACS) is a when there is not enough blood and oxygen being  supplied to the heart. ACS can result in chest pain or a heart attack.  Acute coronary syndrome is a medical emergency. If you have any symptoms of this condition, get help right away.  Treatment includes oxygen, medicines, and procedures to open the blocked arteries and restore blood flow. This information is not intended to replace advice given to you by your health care provider. Make sure you discuss any questions you have with your health care provider. Document Released: 06/26/2005 Document Revised: 07/28/2016 Document Reviewed: 07/28/2016 Elsevier Interactive Patient Education  [employer].

## 2018-04-10 ENCOUNTER — Telehealth (HOSPITAL_COMMUNITY): Payer: Self-pay

## 2018-04-10 NOTE — Telephone Encounter (Signed)
Pt insurance is active and benefits verified through [facility]. Co-pay $10.00, DED $0.00/$0.00 met, out of pocket $3,399.00/$136.45 met, co-insurance 0%. No pre-authorization. Passport, 04/10/18 @ 2:44PM, REF# [id number]  Will contact patient to see if she is interested in the Cardiac Rehab Program. If interested, patient will need to complete follow up appt. Once completed, patient will be contacted for scheduling upon review by the RN Navigator.

## 2018-04-10 NOTE — Telephone Encounter (Signed)
Called patient to see if she is interested in the Cardiac Rehab Program. Patient expressed interest. Explained scheduling process and went over insurance, patient verbalized understanding. Will contact patient for scheduling once f/u has been completed. 

## 2018-04-11 ENCOUNTER — Other Ambulatory Visit: Payer: Self-pay

## 2018-04-11 DIAGNOSIS — I723 Aneurysm of iliac artery: Secondary | ICD-10-CM

## 2018-04-11 DIAGNOSIS — I719 Aortic aneurysm of unspecified site, without rupture: Secondary | ICD-10-CM

## 2018-04-14 DIAGNOSIS — Z955 Presence of coronary angioplasty implant and graft: Secondary | ICD-10-CM | POA: Diagnosis not present

## 2018-04-14 DIAGNOSIS — I350 Nonrheumatic aortic (valve) stenosis: Secondary | ICD-10-CM | POA: Diagnosis not present

## 2018-04-14 DIAGNOSIS — I214 Non-ST elevation (NSTEMI) myocardial infarction: Secondary | ICD-10-CM | POA: Diagnosis not present

## 2018-04-14 DIAGNOSIS — I719 Aortic aneurysm of unspecified site, without rupture: Secondary | ICD-10-CM | POA: Diagnosis not present

## 2018-04-14 DIAGNOSIS — E119 Type 2 diabetes mellitus without complications: Secondary | ICD-10-CM | POA: Diagnosis not present

## 2018-04-14 DIAGNOSIS — Z8673 Personal history of transient ischemic attack (TIA), and cerebral infarction without residual deficits: Secondary | ICD-10-CM | POA: Diagnosis not present

## 2018-04-14 DIAGNOSIS — E785 Hyperlipidemia, unspecified: Secondary | ICD-10-CM | POA: Diagnosis not present

## 2018-04-14 DIAGNOSIS — I10 Essential (primary) hypertension: Secondary | ICD-10-CM | POA: Diagnosis not present

## 2018-04-14 DIAGNOSIS — K219 Gastro-esophageal reflux disease without esophagitis: Secondary | ICD-10-CM | POA: Diagnosis not present

## 2018-04-15 DIAGNOSIS — I719 Aortic aneurysm of unspecified site, without rupture: Secondary | ICD-10-CM | POA: Diagnosis not present

## 2018-04-15 DIAGNOSIS — I10 Essential (primary) hypertension: Secondary | ICD-10-CM | POA: Diagnosis not present

## 2018-04-15 DIAGNOSIS — E785 Hyperlipidemia, unspecified: Secondary | ICD-10-CM | POA: Diagnosis not present

## 2018-04-15 DIAGNOSIS — I350 Nonrheumatic aortic (valve) stenosis: Secondary | ICD-10-CM | POA: Diagnosis not present

## 2018-04-15 DIAGNOSIS — K219 Gastro-esophageal reflux disease without esophagitis: Secondary | ICD-10-CM | POA: Diagnosis not present

## 2018-04-15 DIAGNOSIS — Z8673 Personal history of transient ischemic attack (TIA), and cerebral infarction without residual deficits: Secondary | ICD-10-CM | POA: Diagnosis not present

## 2018-04-15 DIAGNOSIS — E119 Type 2 diabetes mellitus without complications: Secondary | ICD-10-CM | POA: Diagnosis not present

## 2018-04-15 DIAGNOSIS — Z955 Presence of coronary angioplasty implant and graft: Secondary | ICD-10-CM | POA: Diagnosis not present

## 2018-04-15 DIAGNOSIS — I214 Non-ST elevation (NSTEMI) myocardial infarction: Secondary | ICD-10-CM | POA: Diagnosis not present

## 2018-04-19 DIAGNOSIS — I2510 Atherosclerotic heart disease of native coronary artery without angina pectoris: Secondary | ICD-10-CM | POA: Diagnosis not present

## 2018-04-19 DIAGNOSIS — E119 Type 2 diabetes mellitus without complications: Secondary | ICD-10-CM | POA: Diagnosis not present

## 2018-04-19 DIAGNOSIS — I10 Essential (primary) hypertension: Secondary | ICD-10-CM | POA: Diagnosis not present

## 2018-04-19 DIAGNOSIS — F329 Major depressive disorder, single episode, unspecified: Secondary | ICD-10-CM | POA: Diagnosis not present

## 2018-04-19 DIAGNOSIS — K219 Gastro-esophageal reflux disease without esophagitis: Secondary | ICD-10-CM | POA: Diagnosis not present

## 2018-04-19 DIAGNOSIS — E785 Hyperlipidemia, unspecified: Secondary | ICD-10-CM | POA: Diagnosis not present

## 2018-04-19 DIAGNOSIS — M545 Low back pain: Secondary | ICD-10-CM | POA: Diagnosis not present

## 2018-05-14 ENCOUNTER — Encounter: Payer: Self-pay | Admitting: Cardiology

## 2018-05-14 ENCOUNTER — Ambulatory Visit: Payer: Medicare HMO | Admitting: Cardiology

## 2018-05-14 VITALS — BP 146/84 | HR 67 | Ht 65.0 in | Wt 225.0 lb

## 2018-05-14 DIAGNOSIS — I350 Nonrheumatic aortic (valve) stenosis: Secondary | ICD-10-CM | POA: Diagnosis not present

## 2018-05-14 DIAGNOSIS — E669 Obesity, unspecified: Secondary | ICD-10-CM | POA: Diagnosis not present

## 2018-05-14 DIAGNOSIS — I10 Essential (primary) hypertension: Secondary | ICD-10-CM | POA: Diagnosis not present

## 2018-05-14 DIAGNOSIS — E118 Type 2 diabetes mellitus with unspecified complications: Secondary | ICD-10-CM | POA: Diagnosis not present

## 2018-05-14 DIAGNOSIS — I2510 Atherosclerotic heart disease of native coronary artery without angina pectoris: Secondary | ICD-10-CM | POA: Diagnosis not present

## 2018-05-14 DIAGNOSIS — E782 Mixed hyperlipidemia: Secondary | ICD-10-CM | POA: Diagnosis not present

## 2018-05-14 LAB — BASIC METABOLIC PANEL
BUN/Creatinine Ratio: 16 (ref 12–28)
BUN: 15 mg/dL (ref 8–27)
CALCIUM: 9.8 mg/dL (ref 8.7–10.3)
CO2: 21 mmol/L (ref 20–29)
CREATININE: 0.93 mg/dL (ref 0.57–1.00)
Chloride: 108 mmol/L — ABNORMAL HIGH (ref 96–106)
GFR calc Af Amer: 72 mL/min/{1.73_m2} (ref >59)
GFR calc non Af Amer: 62 mL/min/{1.73_m2} (ref >59)
GLUCOSE: 82 mg/dL (ref 65–99)
Potassium: 3.9 mmol/L (ref 3.5–5.2)
SODIUM: 143 mmol/L (ref 134–144)

## 2018-05-14 MED ORDER — LISINOPRIL 20 MG PO TABS: 10.0000 mg | ORAL_TABLET | Freq: Every day | ORAL | 3 refills | Status: DC

## 2018-05-14 NOTE — Progress Notes (Signed)
Cardiology Office Note:    Date:  05/14/2018   ID:  Vicki Rogers, DOB 04-29-1946, MRN [id number]  PCP:  Jackie Plum, MD  Cardiologist:  Parke Poisson, MD  Referring MD: Jackie Plum, MD   Chief Complaint  Patient presents with  . Hospitalization Follow-up    NSTEMI    History of Present Illness:    Vicki Rogers is a 72 y.o. female with a past medical history significant for DM type II, diverticulosis, hypertension and stroke.  Patient was admitted to the hospital [id number] 07/29/2017 for non-STEMI.  Her symptoms had included chest pain that radiated to her right jaw, nausea and vomiting, and upper abdominal pain.  Troponins peaked at 8.79.  She was taken to the Cath Lab on 04/08/2018 and underwent successful PCI/DES to mid LAD with plans for uninterrupted dual antiplatelet therapy with aspirin 81 mg and ticagrelor 90 mg twice daily for minimum of 12 months.  Echocardiogram on 04/09/2018 showed preserved LV systolic function with EF 60-65%, grade 2 diastolic dysfunction.  Right radial cath site is healing but still tender. She aslo has tenderness in her right hand going up her wrist. She has full function and good perfusion. She had some mild chest discomfort on the first 2 nights after discharge, none since. She has mild shortness of breath with exertion, better since stent placed. She has slept on 2 pillows for many years.   She walks every day, ~10-20 minutes, several times without any exertional symptoms.   Past Medical History:  Diagnosis Date  . Aortic stenosis   . Arthritis   . Diabetes mellitus without complication (HCC)   . Diverticulosis   . Hypertension   . NSTEMI (non-ST elevated myocardial infarction) (HCC)   . Stroke [facility])     Past Surgical History:  Procedure Laterality Date  . ABDOMINAL SURGERY    . CORONARY STENT INTERVENTION N/A 04/08/2018   Procedure: CORONARY STENT INTERVENTION;  Surgeon: Tonny Bollman, MD;  Location: [facility] INVASIVE CV LAB;   Service: Cardiovascular;  Laterality: N/A;  . LEFT HEART CATH AND CORONARY ANGIOGRAPHY N/A 04/08/2018   Procedure: LEFT HEART CATH AND CORONARY ANGIOGRAPHY;  Surgeon: Tonny Bollman, MD;  Location: [facility] INVASIVE CV LAB;  Service: Cardiovascular;  Laterality: N/A;    Current Medications: Current Meds  Medication Sig  . aspirin 81 MG EC tablet Take 1 tablet (81 mg total) by mouth daily.  Marland Kitchen atorvastatin (LIPITOR) 80 MG tablet Take 1 tablet (80 mg total) by mouth daily at 6 PM.  . carboxymethylcellul-glycerin (OPTIVE) 0.5-0.9 % ophthalmic solution Place 1 drop into both eyes as needed for dry eyes.  Marland Kitchen glipiZIDE (GLUCOTROL XL) 5 MG 24 hr tablet Take 5 mg by mouth daily with breakfast.  . lisinopril (PRINIVIL,ZESTRIL) 20 MG tablet Take 0.5 tablets (10 mg total) by mouth daily.  . metoprolol tartrate (LOPRESSOR) 25 MG tablet Take 1 tablet (25 mg total) by mouth 2 (two) times daily.  . nitroGLYCERIN (NITROSTAT) 0.4 MG SL tablet Place 1 tablet (0.4 mg total) under the tongue every 5 (five) minutes as needed for chest pain.  Marland Kitchen RABEPRAZOLE SODIUM PO Take by mouth daily.  . ticagrelor (BRILINTA) 90 MG TABS tablet Take 1 tablet (90 mg total) by mouth 2 (two) times daily.  . [DISCONTINUED] lisinopril (PRINIVIL,ZESTRIL) 10 MG tablet Take 10 mg by mouth daily.  . [DISCONTINUED] lisinopril (PRINIVIL,ZESTRIL) 5 MG tablet Take 1 tablet (5 mg total) by mouth daily.  . [DISCONTINUED] OMEPRAZOLE PO Take 1 capsule by mouth  daily as needed (heartburn).     Allergies:   Penicillins; Shrimp [shellfish allergy]; and Metformin and related   Social History   Socioeconomic History  . Marital status: Widowed    Spouse name: Not on file  . Number of children: Not on file  . Years of education: Not on file  . Highest education level: Not on file  Occupational History  . Not on file  Social Needs  . Financial resource strain: Not on file  . Food insecurity:    Worry: Not on file    Inability: Not on file  .  Transportation needs:    Medical: Not on file    Non-medical: Not on file  Tobacco Use  . Smoking status: Never Smoker  . Smokeless tobacco: Never Used  Substance and Sexual Activity  . Alcohol use: No  . Drug use: No  . Sexual activity: Not on file  Lifestyle  . Physical activity:    Days per week: Not on file    Minutes per session: Not on file  . Stress: Not on file  Relationships  . Social connections:    Talks on phone: Not on file    Gets together: Not on file    Attends religious service: Not on file    Active member of club or organization: Not on file    Attends meetings of clubs or organizations: Not on file    Relationship status: Not on file  Other Topics Concern  . Not on file  Social History Narrative  . Not on file     Family History: The patient's family history includes Diabetes in her mother. ROS:   Please see the history of present illness.     All other systems reviewed and are negative.  EKGs/Labs/Other Studies Reviewed:    The following studies were reviewed today:  Echocardiogram 04/09/2018 Study Conclusions - Left ventricle: The cavity size was normal. Wall thickness was   normal. Systolic function was normal. The estimated ejection   fraction was in the range of 60% to 65%. Wall motion was normal;   there were no regional wall motion abnormalities. Features are   consistent with a pseudonormal left ventricular filling pattern,   with concomitant abnormal relaxation and increased filling   pressure (grade 2 diastolic dysfunction). - Aortic valve: Mildly to moderately calcified annulus. Moderately   thickened, moderately calcified leaflets. There was mild to   moderate stenosis. Valve area (VTI): 1.01 cm^2. Valve area   (Vmax): 0.92 cm^2. Valve area (Vmean): 0.91 cm^2. - Mitral valve: Mildly calcified annulus. Mildly thickened, mildly   calcified leaflets .   LEFT HEART CATH AND CORONARY ANGIOGRAPHY 04/08/18  Conclusion    Prox RCA to  Mid RCA lesion is 30% stenosed.  Mid RCA lesion is 50% stenosed.  Mid LM to Dist LM lesion is 40% stenosed.  Prox LAD to Mid LAD lesion is 90% stenosed.  Ost 1st Diag to 1st Diag lesion is 40% stenosed.  A drug-eluting stent was successfully placed using a STENT ORSIRO 3.0X22.  Post intervention, there is a 0% residual stenosis.   1. Severe mid-LAD stenosis, treated successfully with a 3.0x22 mm Orsiro DES 2. Nonobstructive left main, LCx, and RCA stenosis, appropriate for aggressive medical therapy  Recommend uninterrupted dual antiplatelet therapy with Aspirin 81mg  daily and Ticagrelor 90mg  twice daily for a minimum of 12 months (ACS - Class I recommendation).   Check 2D Echo for assessment of LV function/wall motion  EKG:  EKG is not ordered today.   Recent Labs: 04/07/2018: ALT 16; TSH 1.943 04/09/2018: Hemoglobin 11.4; Platelets 262 05/14/2018: BUN 15; Creatinine, Ser 0.93; Potassium 3.9; Sodium 143   Recent Lipid Panel    Component Value Date/Time   CHOL 244 (H) 04/07/2018 2321   TRIG 56 04/07/2018 2321   HDL 62 04/07/2018 2321   CHOLHDL 3.9 04/07/2018 2321   VLDL 11 04/07/2018 2321   LDLCALC 171 (H) 04/07/2018 2321    Physical Exam:    VS:  BP (!) 146/84   Pulse 67   Ht 5\' 5"  (1.651 m)   Wt 225 lb (102.1 kg)   SpO2 99%   BMI 37.44 kg/m     Wt Readings from Last 3 Encounters:  05/14/18 225 lb (102.1 kg)  04/07/18 208 lb 6.4 oz (94.5 kg)  09/09/15 230 lb (104.3 kg)     Physical Exam  Constitutional: She is oriented to person, place, and time. She appears well-developed and well-nourished. No distress.  HENT:  Head: Normocephalic and atraumatic.  Neck: Normal range of motion. Neck supple. No JVD present.  Cardiovascular: Normal rate, regular rhythm, normal heart sounds and intact distal pulses. Exam reveals no gallop and no friction rub.  No murmur heard. Pulmonary/Chest: Effort normal and breath sounds normal. No respiratory distress. She has no  wheezes. She has no rales.  Abdominal: Soft. Bowel sounds are normal.  Musculoskeletal: Normal range of motion. She exhibits no edema or deformity.  Neurological: She is alert and oriented to person, place, and time.  Skin: Skin is warm and dry.  Psychiatric: She has a normal mood and affect. Her behavior is normal. Judgment and thought content normal.  Vitals reviewed.    ASSESSMENT:    1. Coronary artery disease involving native coronary artery of native heart without angina pectoris   2. Mixed hyperlipidemia   3. Essential (primary) hypertension   4. Type 2 diabetes mellitus with complication, without long-term current use of insulin (HCC)   5. Aortic valve stenosis, etiology of cardiac valve disease unspecified   6. Obesity (BMI 30-39.9)    PLAN:    In order of problems listed above:  CAD S/P NSTEMI: Pt underwent successful PCI/DES to mLAD on 04/08/18 with plans for uninterrupted DAPT with ASA 81 and Ticagrelor 90mg  twice daily for a minimum of 12 months.  Also on beta-blocker and statin.  No further chest pain and breathing has improved after stent placement. Working on secondary prevention with diet, exercise, blood pressure control, cholesterol management, weight loss.  Hyperlipidemia: LDL 171 with goal of <70.  High intensity statin was initiated.  Will check lipid profile and LFTs in 4 weeks.  Hypertension: Considering her diabetes, amlodipine was transitioned to lisinopril.  Initiated with low dose, Increased from 5 to 10 mg by PCP.  BP still a little high.  Will increase increase to 20 mg.  Check metabolic panel  Diabetes type 2: Hemoglobin A1c, 6.4, at goal of <7  Aortic stenosis: Mild-moderate by echo.  Mean gradient 14 mmHg.    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 Plan for follow-up aortic ultrasound in 10/2018  Obesity: Body mass index is 37.44 kg/m.  We discussed  shooting for a 20 pound weight loss.  Discussed heart healthy diet, calorie reduction and exercise.  Medication Adjustments/Labs and Tests Ordered: Current medicines are reviewed at length with the patient today.  Concerns regarding medicines are outlined  above. Labs and tests ordered and medication changes are outlined in the patient instructions below:  Patient Instructions  Medication Instructions:  INCREASE: Lisinopril to 20 MG daily  If you need a refill on your cardiac medications before your next appointment, please call your pharmacy.   Lab work: TODAY: Nurse, children's recommends that you return for a FASTING lipid profile and LFT's in 4 weeks    If you have labs (blood work) drawn today and your tests are completely normal, you will receive your results only by: Marland Kitchen MyChart Message (if you have MyChart) OR . A paper copy in the mail If you have any lab test that is abnormal or we need to change your treatment, we will call you to review the results.  Testing/Procedures: None  Follow-Up: At [facility], you and your health needs are our priority.  As part of our continuing mission to provide you with exceptional heart care, we have created designated Provider Care Teams.  These Care Teams include your primary Cardiologist (physician) and Advanced Practice Providers (APPs -  Physician Assistants and Nurse Practitioners) who all work together to provide you with the care you need, when you need it. You will need a follow up appointment in 4 months.  Please call our office 2 months in advance to schedule this appointment.  You may see Dr. Rudene Anda or one of the following Advanced Practice Providers on your designated Care Team:   Theodore Demark, PA-C . Joni Reining, DNP, ANP  Any Other Special Instructions Will Be Listed Below (If Applicable).  DASH Eating Plan DASH stands for "Dietary Approaches to Stop Hypertension." The DASH eating plan is a healthy eating plan that  has been shown to reduce high blood pressure (hypertension). It may also reduce your risk for type 2 diabetes, heart disease, and stroke. The DASH eating plan may also help with weight loss. What are tips for following this plan? General guidelines  Avoid eating more than 2,300 mg (milligrams) of salt (sodium) a day. If you have hypertension, you may need to reduce your sodium intake to 1,500 mg a day.  Limit alcohol intake to no more than 1 drink a day for nonpregnant women and 2 drinks a day for men. One drink equals 12 oz of beer, 5 oz of wine, or 1 oz of hard liquor.  Work with your health care provider to maintain a healthy body weight or to lose weight. Ask what an ideal weight is for you.  Get at least 30 minutes of exercise that causes your heart to beat faster (aerobic exercise) most days of the week. Activities may include walking, swimming, or biking.  Work with your health care provider or diet and nutrition specialist (dietitian) to adjust your eating plan to your individual calorie needs. Reading food labels  Check food labels for the amount of sodium per serving. Choose foods with less than 5 percent of the Daily Value of sodium. Generally, foods with less than 300 mg of sodium per serving fit into this eating plan.  To find whole grains, look for the word "whole" as the first word in the ingredient list. Shopping  Buy products labeled as "low-sodium" or "no salt added."  Buy fresh foods. Avoid canned foods and premade or frozen meals. Cooking  Avoid adding salt when cooking. Use salt-free seasonings or herbs instead of table salt or sea salt. Check with your health care provider or pharmacist before using salt substitutes.  Do not fry foods.  Esson foods using healthy methods such as baking, boiling, grilling, and broiling instead.  Wareing with heart-healthy oils, such as olive, canola, soybean, or sunflower oil. Meal planning   Eat a balanced diet that includes: ? 5 or  more servings of fruits and vegetables each day. At each meal, try to fill half of your plate with fruits and vegetables. ? Up to 6-8 servings of whole grains each day. ? Less than 6 oz of lean meat, poultry, or fish each day. A 3-oz serving of meat is about the same size as a deck of cards. One egg equals 1 oz. ? 2 servings of low-fat dairy each day. ? A serving of nuts, seeds, or beans 5 times each week. ? Heart-healthy fats. Healthy fats called Omega-3 fatty acids are found in foods such as flaxseeds and coldwater fish, like sardines, salmon, and mackerel.  Limit how much you eat of the following: ? Canned or prepackaged foods. ? Food that is high in trans fat, such as fried foods. ? Food that is high in saturated fat, such as fatty meat. ? Sweets, desserts, sugary drinks, and other foods with added sugar. ? Full-fat dairy products.  Do not salt foods before eating.  Try to eat at least 2 vegetarian meals each week.  Eat more home-cooked food and less restaurant, buffet, and fast food.  When eating at a restaurant, ask that your food be prepared with less salt or no salt, if possible. What foods are recommended? The items listed may not be a complete list. Talk with your dietitian about what dietary choices are best for you. Grains Whole-grain or whole-wheat bread. Whole-grain or whole-wheat pasta. Brown rice. Orpah Cobb. Bulgur. Whole-grain and low-sodium cereals. Pita bread. Low-fat, low-sodium crackers. Whole-wheat flour tortillas. Vegetables Fresh or frozen vegetables (raw, steamed, roasted, or grilled). Low-sodium or reduced-sodium tomato and vegetable juice. Low-sodium or reduced-sodium tomato sauce and tomato paste. Low-sodium or reduced-sodium canned vegetables. Fruits All fresh, dried, or frozen fruit. Canned fruit in natural juice (without added sugar). Meat and other protein foods Skinless chicken or [place]. Ground chicken or [place]. Pork with fat trimmed off. Fish  and seafood. Egg whites. Dried beans, peas, or lentils. Unsalted nuts, nut butters, and seeds. Unsalted canned beans. Lean cuts of beef with fat trimmed off. Low-sodium, lean deli meat. Dairy Low-fat (1%) or fat-free (skim) milk. Fat-free, low-fat, or reduced-fat cheeses. Nonfat, low-sodium ricotta or cottage cheese. Low-fat or nonfat yogurt. Low-fat, low-sodium cheese. Fats and oils Soft margarine without trans fats. Vegetable oil. Low-fat, reduced-fat, or light mayonnaise and salad dressings (reduced-sodium). Canola, safflower, olive, soybean, and sunflower oils. Avocado. Seasoning and other foods Herbs. Spices. Seasoning mixes without salt. Unsalted popcorn and pretzels. Fat-free sweets. What foods are not recommended? The items listed may not be a complete list. Talk with your dietitian about what dietary choices are best for you. Grains Baked goods made with fat, such as croissants, muffins, or some breads. Dry pasta or rice meal packs. Vegetables Creamed or fried vegetables. Vegetables in a cheese sauce. Regular canned vegetables (not low-sodium or reduced-sodium). Regular canned tomato sauce and paste (not low-sodium or reduced-sodium). Regular tomato and vegetable juice (not low-sodium or reduced-sodium). Rosita Fire. Olives. Fruits Canned fruit in a light or heavy syrup. Fried fruit. Fruit in cream or butter sauce. Meat and other protein foods Fatty cuts of meat. Ribs. Fried meat. Tomasa Blase. Sausage. Bologna and other processed lunch meats. Salami. Fatback. Hotdogs. Bratwurst. Salted nuts and seeds. Canned beans with added salt. Canned  or smoked fish. Whole eggs or egg yolks. Chicken or [place] with skin. Dairy Whole or 2% milk, cream, and half-and-half. Whole or full-fat cream cheese. Whole-fat or sweetened yogurt. Full-fat cheese. Nondairy creamers. Whipped toppings. Processed cheese and cheese spreads. Fats and oils Butter. Stick margarine. Lard. Shortening. Ghee. Bacon fat. Tropical oils,  such as coconut, palm kernel, or palm oil. Seasoning and other foods Salted popcorn and pretzels. Onion salt, garlic salt, seasoned salt, table salt, and sea salt. Worcestershire sauce. Tartar sauce. Barbecue sauce. Teriyaki sauce. Soy sauce, including reduced-sodium. Steak sauce. Canned and packaged gravies. Fish sauce. Oyster sauce. Cocktail sauce. Horseradish that you find on the shelf. Ketchup. Mustard. Meat flavorings and tenderizers. Bouillon cubes. Hot sauce and Tabasco sauce. Premade or packaged marinades. Premade or packaged taco seasonings. Relishes. Regular salad dressings. Where to find more information:  National Heart, Lung, and Blood Institute: PopSteam.is  American Heart Association: www.heart.org Summary  The DASH eating plan is a healthy eating plan that has been shown to reduce high blood pressure (hypertension). It may also reduce your risk for type 2 diabetes, heart disease, and stroke.  With the DASH eating plan, you should limit salt (sodium) intake to 2,300 mg a day. If you have hypertension, you may need to reduce your sodium intake to 1,500 mg a day.  When on the DASH eating plan, aim to eat more fresh fruits and vegetables, whole grains, lean proteins, low-fat dairy, and heart-healthy fats.  Work with your health care provider or diet and nutrition specialist (dietitian) to adjust your eating plan to your individual calorie needs. This information is not intended to replace advice given to you by your health care provider. Make sure you discuss any questions you have with your health care provider. Document Released: 06/15/2011 Document Revised: 06/19/2016 Document Reviewed: 06/19/2016 Elsevier Interactive Patient Education  [employer].       Signed, Berton Bon, NP  05/14/2018 5:41 PM    [facility]

## 2018-05-14 NOTE — Patient Instructions (Addendum)
Medication Instructions:  INCREASE: Lisinopril to 20 MG daily  If you need a refill on your cardiac medications before your next appointment, please call your pharmacy.   Lab work: TODAY: Nurse, [facility] recommends that you return for a FASTING lipid profile and LFT's in 4 weeks    If you have labs (blood work) drawn today and your tests are completely normal, you will receive your results only by: Marland Kitchen MyChart Message (if you have MyChart) OR . A paper copy in the mail If you have any lab test that is abnormal or we need to change your treatment, we will call you to review the results.  Testing/Procedures: None  Follow-Up: At [facility], you and your health needs are our priority.  As part of our continuing mission to provide you with exceptional heart care, we have created designated Provider Care Teams.  These Care Teams include your primary Cardiologist (physician) and Advanced Practice Providers (APPs -  Physician Assistants and Nurse Practitioners) who all work together to provide you with the care you need, when you need it. You will need a follow up appointment in 4 months.  Please call our office 2 months in advance to schedule this appointment.  You may see Dr. Rudene Anda or one of the following Advanced Practice Providers on your designated Care Team:   Theodore Demark, PA-C . Joni Reining, DNP, ANP  Any Other Special Instructions Will Be Listed Below (If Applicable).  DASH Eating Plan DASH stands for "Dietary Approaches to Stop Hypertension." The DASH eating plan is a healthy eating plan that has been shown to reduce high blood pressure (hypertension). It may also reduce your risk for type 2 diabetes, heart disease, and stroke. The DASH eating plan may also help with weight loss. What are tips for following this plan? General guidelines  Avoid eating more than 2,300 mg (milligrams) of salt (sodium) a day. If you have hypertension, you may need to reduce your sodium  intake to 1,500 mg a day.  Limit alcohol intake to no more than 1 drink a day for nonpregnant women and 2 drinks a day for men. One drink equals 12 oz of beer, 5 oz of wine, or 1 oz of hard liquor.  Work with your health care provider to maintain a healthy body weight or to lose weight. Ask what an ideal weight is for you.  Get at least 30 minutes of exercise that causes your heart to beat faster (aerobic exercise) most days of the week. Activities may include walking, swimming, or biking.  Work with your health care provider or diet and nutrition specialist (dietitian) to adjust your eating plan to your individual calorie needs. Reading food labels  Check food labels for the amount of sodium per serving. Choose foods with less than 5 percent of the Daily Value of sodium. Generally, foods with less than 300 mg of sodium per serving fit into this eating plan.  To find whole grains, look for the word "whole" as the first word in the ingredient list. Shopping  Buy products labeled as "low-sodium" or "no salt added."  Buy fresh foods. Avoid canned foods and premade or frozen meals. Cooking  Avoid adding salt when cooking. Use salt-free seasonings or herbs instead of table salt or sea salt. Check with your health care provider or pharmacist before using salt substitutes.  Do not fry foods. Mclester foods using healthy methods such as baking, boiling, grilling, and broiling instead.  Pelly with heart-healthy oils, such  as olive, canola, soybean, or sunflower oil. Meal planning   Eat a balanced diet that includes: ? 5 or more servings of fruits and vegetables each day. At each meal, try to fill half of your plate with fruits and vegetables. ? Up to 6-8 servings of whole grains each day. ? Less than 6 oz of lean meat, poultry, or fish each day. A 3-oz serving of meat is about the same size as a deck of cards. One egg equals 1 oz. ? 2 servings of low-fat dairy each day. ? A serving of nuts,  seeds, or beans 5 times each week. ? Heart-healthy fats. Healthy fats called Omega-3 fatty acids are found in foods such as flaxseeds and coldwater fish, like sardines, salmon, and mackerel.  Limit how much you eat of the following: ? Canned or prepackaged foods. ? Food that is high in trans fat, such as fried foods. ? Food that is high in saturated fat, such as fatty meat. ? Sweets, desserts, sugary drinks, and other foods with added sugar. ? Full-fat dairy products.  Do not salt foods before eating.  Try to eat at least 2 vegetarian meals each week.  Eat more home-cooked food and less restaurant, buffet, and fast food.  When eating at a restaurant, ask that your food be prepared with less salt or no salt, if possible. What foods are recommended? The items listed may not be a complete list. Talk with your dietitian about what dietary choices are best for you. Grains Whole-grain or whole-wheat bread. Whole-grain or whole-wheat pasta. Brown rice. Orpah Cobb. Bulgur. Whole-grain and low-sodium cereals. Pita bread. Low-fat, low-sodium crackers. Whole-wheat flour tortillas. Vegetables Fresh or frozen vegetables (raw, steamed, roasted, or grilled). Low-sodium or reduced-sodium tomato and vegetable juice. Low-sodium or reduced-sodium tomato sauce and tomato paste. Low-sodium or reduced-sodium canned vegetables. Fruits All fresh, dried, or frozen fruit. Canned fruit in natural juice (without added sugar). Meat and other protein foods Skinless chicken or [place]. Ground chicken or [place]. Pork with fat trimmed off. Fish and seafood. Egg whites. Dried beans, peas, or lentils. Unsalted nuts, nut butters, and seeds. Unsalted canned beans. Lean cuts of beef with fat trimmed off. Low-sodium, lean deli meat. Dairy Low-fat (1%) or fat-free (skim) milk. Fat-free, low-fat, or reduced-fat cheeses. Nonfat, low-sodium ricotta or cottage cheese. Low-fat or nonfat yogurt. Low-fat, low-sodium cheese. Fats  and oils Soft margarine without trans fats. Vegetable oil. Low-fat, reduced-fat, or light mayonnaise and salad dressings (reduced-sodium). Canola, safflower, olive, soybean, and sunflower oils. Avocado. Seasoning and other foods Herbs. Spices. Seasoning mixes without salt. Unsalted popcorn and pretzels. Fat-free sweets. What foods are not recommended? The items listed may not be a complete list. Talk with your dietitian about what dietary choices are best for you. Grains Baked goods made with fat, such as croissants, muffins, or some breads. Dry pasta or rice meal packs. Vegetables Creamed or fried vegetables. Vegetables in a cheese sauce. Regular canned vegetables (not low-sodium or reduced-sodium). Regular canned tomato sauce and paste (not low-sodium or reduced-sodium). Regular tomato and vegetable juice (not low-sodium or reduced-sodium). Rosita Fire. Olives. Fruits Canned fruit in a light or heavy syrup. Fried fruit. Fruit in cream or butter sauce. Meat and other protein foods Fatty cuts of meat. Ribs. Fried meat. Tomasa Blase. Sausage. Bologna and other processed lunch meats. Salami. Fatback. Hotdogs. Bratwurst. Salted nuts and seeds. Canned beans with added salt. Canned or smoked fish. Whole eggs or egg yolks. Chicken or [place] with skin. Dairy Whole or 2% milk, cream,  and half-and-half. Whole or full-fat cream cheese. Whole-fat or sweetened yogurt. Full-fat cheese. Nondairy creamers. Whipped toppings. Processed cheese and cheese spreads. Fats and oils Butter. Stick margarine. Lard. Shortening. Ghee. Bacon fat. Tropical oils, such as coconut, palm kernel, or palm oil. Seasoning and other foods Salted popcorn and pretzels. Onion salt, garlic salt, seasoned salt, table salt, and sea salt. Worcestershire sauce. Tartar sauce. Barbecue sauce. Teriyaki sauce. Soy sauce, including reduced-sodium. Steak sauce. Canned and packaged gravies. Fish sauce. Oyster sauce. Cocktail sauce. Horseradish that you find on  the shelf. Ketchup. Mustard. Meat flavorings and tenderizers. Bouillon cubes. Hot sauce and Tabasco sauce. Premade or packaged marinades. Premade or packaged taco seasonings. Relishes. Regular salad dressings. Where to find more information:  National Heart, Lung, and Blood Institute: PopSteam.is  American Heart Association: www.heart.org Summary  The DASH eating plan is a healthy eating plan that has been shown to reduce high blood pressure (hypertension). It may also reduce your risk for type 2 diabetes, heart disease, and stroke.  With the DASH eating plan, you should limit salt (sodium) intake to 2,300 mg a day. If you have hypertension, you may need to reduce your sodium intake to 1,500 mg a day.  When on the DASH eating plan, aim to eat more fresh fruits and vegetables, whole grains, lean proteins, low-fat dairy, and heart-healthy fats.  Work with your health care provider or diet and nutrition specialist (dietitian) to adjust your eating plan to your individual calorie needs. This information is not intended to replace advice given to you by your health care provider. Make sure you discuss any questions you have with your health care provider. Document Released: 06/15/2011 Document Revised: 06/19/2016 Document Reviewed: 06/19/2016 Elsevier Interactive Patient Education  [employer].

## 2018-05-15 ENCOUNTER — Other Ambulatory Visit: Payer: Self-pay

## 2018-05-15 MED ORDER — LISINOPRIL 20 MG PO TABS: 20.0000 mg | ORAL_TABLET | Freq: Every day | ORAL | 3 refills | Status: DC

## 2018-05-16 ENCOUNTER — Telehealth (HOSPITAL_COMMUNITY): Payer: Self-pay

## 2018-05-16 NOTE — Telephone Encounter (Signed)
Attempted to contact patient in regards to Cardiac Rehab - could not leave vm. Sending letter. °

## 2018-05-23 ENCOUNTER — Telehealth (HOSPITAL_COMMUNITY): Payer: Self-pay

## 2018-05-23 NOTE — Telephone Encounter (Signed)
Called and spoke with patient in regards to Cardiac Rehab - scheduled orientation on 07/23/18 at 8:30am. Pt will attend the 9:45am exc class. Mailed packet.

## 2018-06-10 DIAGNOSIS — K219 Gastro-esophageal reflux disease without esophagitis: Secondary | ICD-10-CM | POA: Diagnosis not present

## 2018-06-10 DIAGNOSIS — E119 Type 2 diabetes mellitus without complications: Secondary | ICD-10-CM | POA: Diagnosis not present

## 2018-06-10 DIAGNOSIS — I10 Essential (primary) hypertension: Secondary | ICD-10-CM | POA: Diagnosis not present

## 2018-06-10 DIAGNOSIS — E559 Vitamin D deficiency, unspecified: Secondary | ICD-10-CM | POA: Diagnosis not present

## 2018-06-10 DIAGNOSIS — F329 Major depressive disorder, single episode, unspecified: Secondary | ICD-10-CM | POA: Diagnosis not present

## 2018-06-10 DIAGNOSIS — M545 Low back pain: Secondary | ICD-10-CM | POA: Diagnosis not present

## 2018-06-10 DIAGNOSIS — E785 Hyperlipidemia, unspecified: Secondary | ICD-10-CM | POA: Diagnosis not present

## 2018-06-10 DIAGNOSIS — I2510 Atherosclerotic heart disease of native coronary artery without angina pectoris: Secondary | ICD-10-CM | POA: Diagnosis not present

## 2018-06-11 ENCOUNTER — Other Ambulatory Visit: Payer: Medicare HMO

## 2018-06-11 DIAGNOSIS — E782 Mixed hyperlipidemia: Secondary | ICD-10-CM | POA: Diagnosis not present

## 2018-06-11 LAB — LIPID PANEL
CHOL/HDL RATIO: 2.4 ratio (ref 0.0–4.4)
Cholesterol, Total: 134 mg/dL (ref 100–199)
HDL: 57 mg/dL (ref >39)
LDL CALC: 65 mg/dL (ref 0–99)
Triglycerides: 62 mg/dL (ref 0–149)
VLDL CHOLESTEROL CAL: 12 mg/dL (ref 5–40)

## 2018-06-11 LAB — HEPATIC FUNCTION PANEL
ALBUMIN: 4 g/dL (ref 3.5–4.8)
ALT: 26 IU/L (ref 0–32)
AST: 22 IU/L (ref 0–40)
Alkaline Phosphatase: 148 IU/L — ABNORMAL HIGH (ref 39–117)
Bilirubin Total: 0.4 mg/dL (ref 0.0–1.2)
Bilirubin, Direct: 0.12 mg/dL (ref 0.00–0.40)
TOTAL PROTEIN: 6.8 g/dL (ref 6.0–8.5)

## 2018-06-18 ENCOUNTER — Telehealth: Payer: Self-pay

## 2018-06-18 NOTE — Telephone Encounter (Signed)
Pt states that her primary care doctor took her off of atorvastatin and started her on Lovastatin 20 mg daily

## 2018-07-15 DIAGNOSIS — K219 Gastro-esophageal reflux disease without esophagitis: Secondary | ICD-10-CM | POA: Diagnosis not present

## 2018-07-15 DIAGNOSIS — I2510 Atherosclerotic heart disease of native coronary artery without angina pectoris: Secondary | ICD-10-CM | POA: Diagnosis not present

## 2018-07-15 DIAGNOSIS — E785 Hyperlipidemia, unspecified: Secondary | ICD-10-CM | POA: Diagnosis not present

## 2018-07-15 DIAGNOSIS — I10 Essential (primary) hypertension: Secondary | ICD-10-CM | POA: Diagnosis not present

## 2018-07-15 DIAGNOSIS — E559 Vitamin D deficiency, unspecified: Secondary | ICD-10-CM | POA: Diagnosis not present

## 2018-07-15 DIAGNOSIS — E119 Type 2 diabetes mellitus without complications: Secondary | ICD-10-CM | POA: Diagnosis not present

## 2018-07-15 DIAGNOSIS — M545 Low back pain: Secondary | ICD-10-CM | POA: Diagnosis not present

## 2018-07-15 DIAGNOSIS — F329 Major depressive disorder, single episode, unspecified: Secondary | ICD-10-CM | POA: Diagnosis not present

## 2018-07-19 ENCOUNTER — Telehealth (HOSPITAL_COMMUNITY): Payer: Self-pay

## 2018-07-22 NOTE — Progress Notes (Signed)
Vicki Rogers 73 y.o. female DOB June 02, 1946 MRN [id number]       Nutrition  No diagnosis found. Past Medical History:  Diagnosis Date  . Aortic stenosis   . Arthritis   . Diabetes mellitus without complication (HCC)   . Diverticulosis   . Hypertension   . NSTEMI (non-ST elevated myocardial infarction) (HCC)   . Stroke [facility])    Meds reviewed.    Current Outpatient Medications (Endocrine & Metabolic):  .  glipiZIDE (GLUCOTROL XL) 5 MG 24 hr tablet, Take 5 mg by mouth daily with breakfast.  Current Outpatient Medications (Cardiovascular):  .  lisinopril (PRINIVIL,ZESTRIL) 20 MG tablet, Take 1 tablet (20 mg total) by mouth daily. Marland Kitchen  lovastatin (MEVACOR) 20 MG tablet, Take 20 mg by mouth daily. .  metoprolol tartrate (LOPRESSOR) 25 MG tablet, Take 1 tablet (25 mg total) by mouth 2 (two) times daily. .  nitroGLYCERIN (NITROSTAT) 0.4 MG SL tablet, Place 1 tablet (0.4 mg total) under the tongue every 5 (five) minutes as needed for chest pain.   Current Outpatient Medications (Analgesics):  .  aspirin 81 MG EC tablet, Take 1 tablet (81 mg total) by mouth daily.  Current Outpatient Medications (Hematological):  .  ticagrelor (BRILINTA) 90 MG TABS tablet, Take 1 tablet (90 mg total) by mouth 2 (two) times daily.  Current Outpatient Medications (Other):  .  carboxymethylcellul-glycerin (OPTIVE) 0.5-0.9 % ophthalmic solution, Place 1 drop into both eyes as needed for dry eyes. Marland Kitchen  RABEPRAZOLE SODIUM PO, Take by mouth daily.   HT: Ht Readings from Last 1 Encounters:  05/14/18 5\' 5"  (1.651 m)    WT: Wt Readings from Last 5 Encounters:  05/14/18 225 lb (102.1 kg)  04/07/18 208 lb 6.4 oz (94.5 kg)  09/09/15 230 lb (104.3 kg)  06/01/12 237 lb (107.5 kg)     BMI= 37.4 05/14/18  Current tobacco use? No       Labs:  Lipid Panel     Component Value Date/Time   CHOL 134 06/11/2018 0932   TRIG 62 06/11/2018 0932   HDL 57 06/11/2018 0932   CHOLHDL 2.4 06/11/2018 0932   CHOLHDL 3.9  04/07/2018 2321   VLDL 11 04/07/2018 2321   LDLCALC 65 06/11/2018 0932    Lab Results  Component Value Date   HGBA1C 6.4 (H) 04/07/2018   CBG (last 3)  No results for input(s): GLUCAP in the last 72 hours.  Nutrition Diagnosis ? Food-and nutrition-related knowledge deficit related to lack of exposure to information as related to diagnosis of: ? CVD ? Type 2 Diabetes  Nutrition Goal(s):  ? To be determined  Plan:  Pt to attend nutrition classes ? Nutrition I ? Nutrition II ? Portion Distortion  ? Diabetes Blitz ? Diabetes Q & A Will provide client-centered nutrition education as part of interdisciplinary care.   Monitor and evaluate progress toward nutrition goal with team.  Ross Marcus, MS, RD, LDN 07/22/2018 8:05 AM

## 2018-07-23 ENCOUNTER — Telehealth (HOSPITAL_COMMUNITY): Payer: Self-pay | Admitting: Cardiac Rehabilitation

## 2018-07-23 ENCOUNTER — Inpatient Hospital Stay (HOSPITAL_COMMUNITY)
Admission: RE | Admit: 2018-07-23 | Discharge: 2018-07-23 | Disposition: A | Discharge status: Home / Self Care | Payer: Medicare HMO | Source: Ambulatory Visit

## 2018-07-23 NOTE — Telephone Encounter (Signed)
pc to pt to assess reason for cardiac rehab orientation absence.  No show, no call.  Unable to leave message, no answering machine available.  Deveron Furlong, RN, BSN Cardiac Pulmonary Rehab

## 2018-07-29 ENCOUNTER — Ambulatory Visit (HOSPITAL_COMMUNITY): Payer: Medicare HMO

## 2018-07-31 ENCOUNTER — Ambulatory Visit (HOSPITAL_COMMUNITY): Payer: Medicare HMO

## 2018-08-02 ENCOUNTER — Ambulatory Visit (HOSPITAL_COMMUNITY): Payer: Medicare HMO

## 2018-08-05 ENCOUNTER — Ambulatory Visit (HOSPITAL_COMMUNITY): Payer: Medicare HMO

## 2018-08-07 ENCOUNTER — Ambulatory Visit (HOSPITAL_COMMUNITY): Payer: Medicare HMO

## 2018-08-09 ENCOUNTER — Ambulatory Visit (HOSPITAL_COMMUNITY): Payer: Medicare HMO

## 2018-08-12 ENCOUNTER — Ambulatory Visit (HOSPITAL_COMMUNITY): Payer: Medicare HMO

## 2018-08-14 ENCOUNTER — Ambulatory Visit (HOSPITAL_COMMUNITY): Payer: Medicare HMO

## 2018-08-16 ENCOUNTER — Ambulatory Visit (HOSPITAL_COMMUNITY): Payer: Medicare HMO

## 2018-08-17 ENCOUNTER — Emergency Department (HOSPITAL_COMMUNITY): Payer: Medicare HMO

## 2018-08-17 ENCOUNTER — Other Ambulatory Visit: Payer: Self-pay

## 2018-08-17 ENCOUNTER — Observation Stay (HOSPITAL_COMMUNITY)
Admission: EM | Admit: 2018-08-17 | Discharge: 2018-08-18 | Disposition: A | Discharge status: Home / Self Care | Payer: Medicare HMO | Attending: Internal Medicine | Admitting: Internal Medicine

## 2018-08-17 ENCOUNTER — Encounter (HOSPITAL_COMMUNITY): Payer: Self-pay | Admitting: Internal Medicine

## 2018-08-17 DIAGNOSIS — I10 Essential (primary) hypertension: Secondary | ICD-10-CM | POA: Diagnosis not present

## 2018-08-17 DIAGNOSIS — E785 Hyperlipidemia, unspecified: Secondary | ICD-10-CM | POA: Diagnosis not present

## 2018-08-17 DIAGNOSIS — R110 Nausea: Secondary | ICD-10-CM | POA: Diagnosis present

## 2018-08-17 DIAGNOSIS — R079 Chest pain, unspecified: Secondary | ICD-10-CM | POA: Diagnosis not present

## 2018-08-17 DIAGNOSIS — Z79899 Other long term (current) drug therapy: Secondary | ICD-10-CM | POA: Insufficient documentation

## 2018-08-17 DIAGNOSIS — I208 Other forms of angina pectoris: Secondary | ICD-10-CM | POA: Diagnosis not present

## 2018-08-17 DIAGNOSIS — R0789 Other chest pain: Secondary | ICD-10-CM | POA: Diagnosis not present

## 2018-08-17 DIAGNOSIS — I723 Aneurysm of iliac artery: Secondary | ICD-10-CM | POA: Diagnosis present

## 2018-08-17 DIAGNOSIS — Z955 Presence of coronary angioplasty implant and graft: Secondary | ICD-10-CM | POA: Insufficient documentation

## 2018-08-17 DIAGNOSIS — E782 Mixed hyperlipidemia: Secondary | ICD-10-CM | POA: Diagnosis not present

## 2018-08-17 DIAGNOSIS — R42 Dizziness and giddiness: Secondary | ICD-10-CM | POA: Diagnosis present

## 2018-08-17 DIAGNOSIS — K219 Gastro-esophageal reflux disease without esophagitis: Secondary | ICD-10-CM | POA: Insufficient documentation

## 2018-08-17 DIAGNOSIS — E119 Type 2 diabetes mellitus without complications: Secondary | ICD-10-CM | POA: Insufficient documentation

## 2018-08-17 DIAGNOSIS — I252 Old myocardial infarction: Secondary | ICD-10-CM | POA: Diagnosis not present

## 2018-08-17 DIAGNOSIS — N289 Disorder of kidney and ureter, unspecified: Secondary | ICD-10-CM | POA: Diagnosis not present

## 2018-08-17 DIAGNOSIS — I2510 Atherosclerotic heart disease of native coronary artery without angina pectoris: Secondary | ICD-10-CM | POA: Diagnosis not present

## 2018-08-17 DIAGNOSIS — E118 Type 2 diabetes mellitus with unspecified complications: Secondary | ICD-10-CM | POA: Diagnosis present

## 2018-08-17 DIAGNOSIS — I350 Nonrheumatic aortic (valve) stenosis: Secondary | ICD-10-CM | POA: Diagnosis not present

## 2018-08-17 DIAGNOSIS — Z8673 Personal history of transient ischemic attack (TIA), and cerebral infarction without residual deficits: Secondary | ICD-10-CM | POA: Diagnosis not present

## 2018-08-17 DIAGNOSIS — I719 Aortic aneurysm of unspecified site, without rupture: Secondary | ICD-10-CM | POA: Diagnosis present

## 2018-08-17 DIAGNOSIS — Z7982 Long term (current) use of aspirin: Secondary | ICD-10-CM | POA: Diagnosis not present

## 2018-08-17 LAB — I-STAT TROPONIN, ED
TROPONIN I, POC: 0.01 ng/mL (ref 0.00–0.08)
Troponin i, poc: 0 ng/mL (ref 0.00–0.08)

## 2018-08-17 LAB — CBC WITH DIFFERENTIAL/PLATELET
Abs Immature Granulocytes: 0.04 10*3/uL (ref 0.00–0.07)
BASOS ABS: 0 10*3/uL (ref 0.0–0.1)
BASOS PCT: 1 %
EOS ABS: 0.1 10*3/uL (ref 0.0–0.5)
EOS PCT: 2 %
HCT: 42 % (ref 36.0–46.0)
HEMOGLOBIN: 13.7 g/dL (ref 12.0–15.0)
Immature Granulocytes: 1 %
LYMPHS ABS: 1.7 10*3/uL (ref 0.7–4.0)
LYMPHS PCT: 23 %
MCH: 27.5 pg (ref 26.0–34.0)
MCHC: 32.6 g/dL (ref 30.0–36.0)
MCV: 84.2 fL (ref 80.0–100.0)
MONO ABS: 0.4 10*3/uL (ref 0.1–1.0)
Monocytes Relative: 5 %
Neutro Abs: 5.1 10*3/uL (ref 1.7–7.7)
Neutrophils Relative %: 68 %
PLATELETS: 308 10*3/uL (ref 150–400)
RBC: 4.99 MIL/uL (ref 3.87–5.11)
RDW: 16.1 % — AB (ref 11.5–15.5)
WBC: 7.5 10*3/uL (ref 4.0–10.5)
nRBC: 0 % (ref 0.0–0.2)

## 2018-08-17 LAB — CBG MONITORING, ED: Glucose-Capillary: 88 mg/dL (ref 70–99)

## 2018-08-17 LAB — COMPREHENSIVE METABOLIC PANEL
ALK PHOS: 118 U/L (ref 38–126)
ALT: 16 U/L (ref 0–44)
AST: 23 U/L (ref 15–41)
Albumin: 3.8 g/dL (ref 3.5–5.0)
Anion gap: 13 (ref 5–15)
BUN: 19 mg/dL (ref 8–23)
CALCIUM: 9.9 mg/dL (ref 8.9–10.3)
CHLORIDE: 107 mmol/L (ref 98–111)
CO2: 21 mmol/L — ABNORMAL LOW (ref 22–32)
CREATININE: 1.11 mg/dL — AB (ref 0.44–1.00)
GFR calc Af Amer: 58 mL/min — ABNORMAL LOW (ref >60)
GFR, EST NON AFRICAN AMERICAN: 50 mL/min — AB (ref >60)
Glucose, Bld: 164 mg/dL — ABNORMAL HIGH (ref 70–99)
Potassium: 4.2 mmol/L (ref 3.5–5.1)
Sodium: 141 mmol/L (ref 135–145)
Total Bilirubin: 0.6 mg/dL (ref 0.3–1.2)
Total Protein: 7.8 g/dL (ref 6.5–8.1)

## 2018-08-17 LAB — URINALYSIS, ROUTINE W REFLEX MICROSCOPIC
Bilirubin Urine: NEGATIVE
GLUCOSE, UA: NEGATIVE mg/dL
HGB URINE DIPSTICK: NEGATIVE
KETONES UR: NEGATIVE mg/dL
LEUKOCYTES UA: NEGATIVE
Nitrite: NEGATIVE
PH: 5 (ref 5.0–8.0)
PROTEIN: NEGATIVE mg/dL
Specific Gravity, Urine: 1.012 (ref 1.005–1.030)

## 2018-08-17 LAB — GLUCOSE, CAPILLARY
Glucose-Capillary: 84 mg/dL (ref 70–99)
Glucose-Capillary: 76 mg/dL (ref 70–99)

## 2018-08-17 LAB — TROPONIN I: Troponin I: <0.03 ng/mL (ref <0.03)

## 2018-08-17 MED ORDER — PRAVASTATIN SODIUM 10 MG PO TABS
20.0000 mg | ORAL_TABLET | Freq: Every day | ORAL | Status: DC
  Administered 2018-08-17: 20 mg via ORAL
  Filled 2018-08-17: qty 2

## 2018-08-17 MED ORDER — MORPHINE SULFATE (PF) 2 MG/ML IV SOLN: 2.0000 mg | INTRAVENOUS | Status: DC | PRN

## 2018-08-17 MED ORDER — TICAGRELOR 90 MG PO TABS
90.0000 mg | ORAL_TABLET | Freq: Two times a day (BID) | ORAL | Status: DC
  Administered 2018-08-17 – 2018-08-18 (×3): 90 mg via ORAL
  Filled 2018-08-17 (×3): qty 1

## 2018-08-17 MED ORDER — ONDANSETRON HCL 4 MG/2ML IJ SOLN: 4.0000 mg | Freq: Four times a day (QID) | INTRAMUSCULAR | Status: DC | PRN

## 2018-08-17 MED ORDER — METOPROLOL TARTRATE 25 MG PO TABS
25.0000 mg | ORAL_TABLET | Freq: Two times a day (BID) | ORAL | Status: DC
  Administered 2018-08-17 – 2018-08-18 (×3): 25 mg via ORAL
  Filled 2018-08-17 (×3): qty 1

## 2018-08-17 MED ORDER — ACETAMINOPHEN 325 MG PO TABS
325.0000 mg | ORAL_TABLET | Freq: Once | ORAL | Status: AC
  Administered 2018-08-17: 325 mg via ORAL
  Filled 2018-08-17: qty 1

## 2018-08-17 MED ORDER — LISINOPRIL 20 MG PO TABS
20.0000 mg | ORAL_TABLET | Freq: Every day | ORAL | Status: DC
  Administered 2018-08-17 – 2018-08-18 (×2): 20 mg via ORAL
  Filled 2018-08-17 (×2): qty 1

## 2018-08-17 MED ORDER — INSULIN ASPART 100 UNIT/ML ~~LOC~~ SOLN: 0.0000 [IU] | Freq: Three times a day (TID) | SUBCUTANEOUS | Status: DC

## 2018-08-17 MED ORDER — NITROGLYCERIN 0.4 MG SL SUBL: 0.4000 mg | SUBLINGUAL_TABLET | SUBLINGUAL | Status: DC | PRN

## 2018-08-17 MED ORDER — GLIPIZIDE ER 5 MG PO TB24
5.0000 mg | ORAL_TABLET | Freq: Every day | ORAL | Status: DC
  Filled 2018-08-17: qty 1

## 2018-08-17 MED ORDER — ENOXAPARIN SODIUM 40 MG/0.4ML ~~LOC~~ SOLN
40.0000 mg | SUBCUTANEOUS | Status: DC
  Administered 2018-08-17: 40 mg via SUBCUTANEOUS
  Filled 2018-08-17 (×2): qty 0.4

## 2018-08-17 MED ORDER — PANTOPRAZOLE SODIUM 40 MG PO TBEC
40.0000 mg | DELAYED_RELEASE_TABLET | Freq: Every day | ORAL | Status: DC
  Administered 2018-08-17 – 2018-08-18 (×2): 40 mg via ORAL
  Filled 2018-08-17 (×2): qty 1

## 2018-08-17 MED ORDER — ACETAMINOPHEN 325 MG PO TABS: 650.0000 mg | ORAL_TABLET | ORAL | Status: DC | PRN

## 2018-08-17 MED ORDER — INSULIN ASPART 100 UNIT/ML ~~LOC~~ SOLN: 0.0000 [IU] | Freq: Every day | SUBCUTANEOUS | Status: DC

## 2018-08-17 MED ORDER — CARBOXYMETHYLCELLUL-GLYCERIN 0.5-0.9 % OP SOLN
1.0000 [drp] | OPHTHALMIC | Status: DC | PRN
  Filled 2018-08-17: qty 15

## 2018-08-17 MED ORDER — ZINC OXIDE 40 % EX OINT
TOPICAL_OINTMENT | CUTANEOUS | Status: DC | PRN
  Administered 2018-08-17: 23:00:00 via TOPICAL
  Filled 2018-08-17: qty 57

## 2018-08-17 MED ORDER — ASPIRIN EC 81 MG PO TBEC
81.0000 mg | DELAYED_RELEASE_TABLET | Freq: Every day | ORAL | Status: DC
  Administered 2018-08-18: 81 mg via ORAL
  Filled 2018-08-17 (×2): qty 1

## 2018-08-17 NOTE — ED Notes (Signed)
Patient requesting food to eat- Food given per EDP

## 2018-08-17 NOTE — ED Triage Notes (Addendum)
Patient reports at 3am she started to have stomach pain-multiple bowel movements, she checked her BP:180/100, she took 25mg  Metoprolol. At 6:57am she started to feel her heart pounding and racing, right chest pain and "tingling where they put the stent in"-she took 1 Nitro and called EMS.  EMS gave 324mg  ASA At this time she feels some throbbing under left breast. Patient is A&O X4, she had a stent placed in the fall of 2019

## 2018-08-17 NOTE — Progress Notes (Signed)
Oriented pt to room, call light within reach  Vitals documented  Assisted pt order lunch  Awaiting medications to be verified  Will continue to monitor

## 2018-08-17 NOTE — ED Provider Notes (Addendum)
[facility] EMERGENCY DEPARTMENT Provider Note   CSN: [id number] Arrival date & time: 08/17/18  1191     History   Chief Complaint No chief complaint on file.   HPI Vicki Rogers is a 73 y.o. female with a PMH of Diabetes, HTN, Aortic Stenosis, HTN, CVA, CAD, and NSTEMI in 2019 presenting with left sided chest pain onset 7am today. Patient describes pain as intermittent non radiating pressure and throbbing. Patient reports she took nitroglycerin and ASA with relief. Patient arrived via EMS. Patient states she had intermittent right sided abdominal pain at 3am this morning with multiple loose stools. Patient states abdominal pain has resolved. Patient denies nausea or vomiting. Patient denies sick contacts. Patient reports diffuse weakness. Patient describes chest pain similar to when she had an NSTEMI. Patient denies DOE, SOB, chest tightness, radiation to left arm, jaw or back, nausea, or diaphoresis. Patient had a stent placed in Fall 2019.  Last echo was in 04/09/18 and it reveals LVEF 60-65%. Last cath was done on 04/08/18 and it reveals severe mid-LAD stenosis, treated successfully with a stent and a nonobstructive left main, LCx, and RCA stenosis. Cardiologist is Dr. Jacques Navy. Patient denies trauma, fever, cough, congestion, leg edema/pain, recent surgery, or recent travel. Patient reports compliance with her medications. Patient denies tobacco, alcohol, or drug use.  HPI  Past Medical History:  Diagnosis Date  . Aortic stenosis   . Arthritis   . Diabetes mellitus without complication (HCC)   . Diverticulosis   . Hypertension   . NSTEMI (non-ST elevated myocardial infarction) (HCC)   . Stroke [facility])     Patient Active Problem List   Diagnosis Date Noted  . CAD (coronary artery disease) 05/14/2018  . Essential (primary) hypertension 05/14/2018  . Chest pain 04/08/2018  . Type 2 diabetes mellitus with complication, without long-term current use of insulin (HCC)  04/08/2018  . Aortic stenosis 04/08/2018  . ST elevation myocardial infarction (STEMI) (HCC)   . Abdominal pain, epigastric   . Hyperlipidemia   . 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 ABDOMINAL SURGERY    . CORONARY STENT INTERVENTION N/A 04/08/2018   Procedure: CORONARY STENT INTERVENTION;  Surgeon: Tonny Bollman, MD;  Location: [facility] INVASIVE CV LAB;  Service: Cardiovascular;  Laterality: N/A;  . LEFT HEART CATH AND CORONARY ANGIOGRAPHY N/A 04/08/2018   Procedure: LEFT HEART CATH AND CORONARY ANGIOGRAPHY;  Surgeon: Tonny Bollman, MD;  Location: [facility] INVASIVE CV LAB;  Service: Cardiovascular;  Laterality: N/A;     OB History   No obstetric history on file.      Home Medications    Prior to Admission medications   Medication Sig Start Date End Date Taking? Authorizing Provider  aspirin 81 MG EC tablet Take 1 tablet (81 mg total) by mouth daily. 04/10/18   Meccariello, Solmon Ice, DO  carboxymethylcellul-glycerin (OPTIVE) 0.5-0.9 % ophthalmic solution Place 1 drop into both eyes as needed for dry eyes.    [provider]  glipiZIDE (GLUCOTROL XL) 5 MG 24 hr tablet Take 5 mg by mouth daily with breakfast.    [provider]  lisinopril (PRINIVIL,ZESTRIL) 20 MG tablet Take 1 tablet (20 mg total) by mouth daily. 05/15/18   Berton Bon, NP  lovastatin (MEVACOR) 20 MG tablet Take 20 mg by mouth daily.    [provider]  metoprolol tartrate (LOPRESSOR) 25 MG tablet Take 1 tablet (25 mg total) by mouth 2 (two)  times daily. 04/09/18   Meccariello, Solmon IceBailey J, DO  nitroGLYCERIN (NITROSTAT) 0.4 MG SL tablet Place 1 tablet (0.4 mg total) under the tongue every 5 (five) minutes as needed for chest pain. 04/09/18 04/09/19  Meccariello, Solmon IceBailey J, DO  RABEPRAZOLE SODIUM PO Take by mouth daily.    [provider]  ticagrelor (BRILINTA) 90 MG TABS tablet Take 1 tablet (90 mg total) by mouth 2 (two) times daily. 04/09/18    Meccariello, Solmon IceBailey J, DO    Family History Family History  Problem Relation Age of Onset  . Diabetes Mother     Social History Social History   Tobacco Use  . Smoking status: Never Smoker  . Smokeless tobacco: Never Used  Substance Use Topics  . Alcohol use: No  . Drug use: No     Allergies   Penicillins; Shrimp [shellfish allergy]; and Metformin and related   Review of Systems Review of Systems  Constitutional: Negative for activity change, appetite change, chills, diaphoresis, fatigue, fever and unexpected weight change.  HENT: Negative for congestion and rhinorrhea.   Respiratory: Negative for cough, chest tightness, shortness of breath and wheezing.   Cardiovascular: Positive for chest pain. Negative for palpitations and leg swelling.  Gastrointestinal: Positive for abdominal pain. Negative for blood in stool, constipation, nausea and vomiting.  Endocrine: Negative for cold intolerance and heat intolerance.  Genitourinary: Negative for dysuria and frequency.  Musculoskeletal: Negative for back pain.  Skin: Negative for rash.  Neurological: Positive for weakness. Negative for dizziness, syncope, light-headedness and headaches.  Psychiatric/Behavioral: Negative for agitation and behavioral problems. The patient is not nervous/anxious.      Physical Exam Updated Vital Signs BP (!) 173/83   Pulse 73   Temp 98.5 F (36.9 C) (Oral)   Resp 16   Ht 5\' 4"  (1.626 m)   Wt 99.8 kg   SpO2 100%   BMI 37.76 kg/m   Physical Exam Vitals signs and nursing note reviewed.  Constitutional:      General: She is not in acute distress.    Appearance: She is well-developed. She is not diaphoretic.  HENT:     Head: Normocephalic and atraumatic.     Right Ear: Tympanic membrane, ear canal and external ear normal.     Left Ear: Tympanic membrane, ear canal and external ear normal.     Nose: Nose normal. No congestion or rhinorrhea.     Mouth/Throat:     Mouth: Mucous  membranes are moist.     Pharynx: No oropharyngeal exudate or posterior oropharyngeal erythema.  Eyes:     Extraocular Movements: Extraocular movements intact.     Conjunctiva/sclera: Conjunctivae normal.     Pupils: Pupils are equal, round, and reactive to light.  Neck:     Musculoskeletal: Normal range of motion and neck supple.     Vascular: No JVD.  Cardiovascular:     Rate and Rhythm: Normal rate and regular rhythm.     Pulses: Normal pulses.          Radial pulses are 2+ on the right side and 2+ on the left side.       Dorsalis pedis pulses are 2+ on the right side and 2+ on the left side.     Heart sounds: Normal heart sounds. No murmur. No friction rub. No gallop.   Pulmonary:     Effort: Pulmonary effort is normal. No respiratory distress.     Breath sounds: Normal breath sounds. No wheezing or rales.  Chest:  Chest wall: Tenderness (Middle and left sided chest wall tenderness upon palpation.) present.  Abdominal:     Palpations: Abdomen is soft.     Tenderness: There is no abdominal tenderness. There is no right CVA tenderness, left CVA tenderness, guarding or rebound.  Musculoskeletal: Normal range of motion.        General: No swelling or tenderness.     Right lower leg: No edema.     Left lower leg: No edema.  Skin:    General: Skin is warm.     Capillary Refill: Capillary refill takes less than 2 seconds.     Coloration: Skin is not pale.     Findings: No rash.  Neurological:     Mental Status: She is alert and oriented to person, place, and time.    Mental Status:  Alert, oriented, thought content appropriate, able to give a coherent history. Speech fluent without evidence of aphasia. Able to follow 2 step commands without difficulty.  Cranial Nerves:  II:  Peripheral visual fields grossly normal, pupils equal, round, reactive to light III,IV, VI: ptosis not present, extra-ocular motions intact bilaterally  V,VII: smile symmetric, facial light touch  sensation equal VIII: hearing grossly normal to voice  X: uvula elevates symmetrically  XI: bilateral shoulder shrug symmetric and strong XII: midline tongue extension without fassiculations Motor:  Normal tone. 5/5 in upper and lower extremities bilaterally including strong and equal grip strength and dorsiflexion/plantar flexion Sensory: Pinprick and light touch normal in all extremities.  Deep Tendon Reflexes: 2+ and symmetric in the biceps and patella Cerebellar: normal finger-to-nose with bilateral upper extremities Gait: normal gait and balance.  Negative pronator drift. Negative Romberg sign. CV: distal pulses palpable throughout   ED Treatments / Results  Labs (all labs ordered are listed, but only abnormal results are displayed) Labs Reviewed  COMPREHENSIVE METABOLIC PANEL - Abnormal; Notable for the following components:      Result Value   CO2 21 (*)    Glucose, Bld 164 (*)    Creatinine, Ser 1.11 (*)    GFR calc non Af Amer 50 (*)    GFR calc Af Amer 58 (*)    All other components within normal limits  CBC WITH DIFFERENTIAL/PLATELET - Abnormal; Notable for the following components:   RDW 16.1 (*)    All other components within normal limits  URINALYSIS, ROUTINE W REFLEX MICROSCOPIC  TROPONIN I  TROPONIN I  I-STAT TROPONIN, ED  CBG MONITORING, ED  I-STAT TROPONIN, ED    EKG EKG Interpretation  Date/Time:  Saturday August 17 2018 08:46:13 EST Ventricular Rate:  84 PR Interval:    QRS Duration: 88 QT Interval:  374 QTC Calculation: 443 R Axis:   43 Text Interpretation:  Sinus rhythm Baseline wander in lead(s) II III aVF Confirmed by Raeford Razor [phone]) on 08/17/2018 9:09:16 AM   Radiology Dg Chest 2 View  Result Date: 08/17/2018 CLINICAL DATA:  Chest pain EXAM: CHEST - 2 VIEW COMPARISON:  Chest radiograph April 07, 2018 and chest CT April 07, 2018 FINDINGS: There is no appreciable edema or consolidation. Heart is mildly enlarged with pulmonary  vascularity normal. There is aortic atherosclerosis. No adenopathy. There is degenerative change in the thoracic spine. IMPRESSION: Stable cardiomegaly. Aortic atherosclerosis. No edema or consolidation. Electronically Signed   By: Bretta Bang III M.D.   On: 08/17/2018 09:43    Procedures Procedures (including critical care time)  Medications Ordered in ED Medications  acetaminophen (TYLENOL) tablet 325 mg (325  mg Oral Given 08/17/18 1237)     Initial Impression / Assessment and Plan / ED Course  I have reviewed the triage vital signs and the nursing notes.  Pertinent labs & imaging results that were available during my care of the patient were reviewed by me and considered in my medical decision making (see chart for details).  Clinical Course as of Aug 17 1250  Sat Aug 17, 2018  1003 CXR reveals stable cardiomegaly and aortic atherosclerosis. No edema or consolidation noted.    DG Chest 2 View [AH]  1045 UA is unremarkable.  Urinalysis, Routine w reflex microscopic [AH]    Clinical Course User Index [AH] Leretha DykesHernandez, Sparrow Sanzo P, PA-C   Concern for cardiac etiology of Chest Pain. Symptoms have been controlled while in the ER. Pt has been re-evaluated prior to consult and VSS, NAD, heart RRR, pain 0/10, lungs CTAB. No acute abnormalities found on EKG and first round of cardiac enzymes negative. Patient has a heart score of 5. This case was discussed with Dr. Juleen ChinaKohut who agrees with plan to admit. Consulted hospitalist and hospitalist has agreed to admit patient.   Final Clinical Impressions(s) / ED Diagnoses   Final diagnoses:  Left-sided chest pain    ED Discharge Orders    None         Leretha DykesHernandez, Sherrol Vicars P, [place]PA-C 08/17/18 1253    Raeford RazorKohut, Stephen, MD 08/18/18 1325

## 2018-08-17 NOTE — ED Notes (Signed)
Pt called out requesting blanket, warm blankets provided. Pt states that she feels her blood sugar is low, measured, 88mg /dL.

## 2018-08-17 NOTE — H&P (Signed)
History and Physical    Vicki Rogers AXK:[id number] DOB: [birth year] DOA: 08/17/2018  PCP: Jackie Plum, MD  Patient coming from: home       I have personally briefly reviewed patient's old medical records available.   Chief Complaint: chest pain   HPI: Vicki Rogers is a 73 y.o. female with medical history significant of hypertension, diabetes, coronary artery disease, non-STEMI with LAD stent on November 2019 on dual antiplatelet therapy, hyperlipidemia who presents to the emergency room with sudden onset of retrosternal discomfort and chest pain since today morning.  According to the patient, she did very well after stent placement.  She was ready to go for rehab.  Today morning at about 650-7:00, she started with a feeling of gas bubble on her lower chest that felt like gas, traveled retrosternal up into the chest then went towards the left side of the chest at precordium, moderate in intensity, had some nausea and dizziness.  She took a nitroglycerin that helped ease the pain and still has some remaining discomfort on the left precordium. Denies any fever chills.  Denies any cough.  Denies any flulike symptoms.  No angina symptoms other than today.   ED Course: Hemodynamically stable.  Currently relieved of acute chest pain with some remaining discomfort.  Electrolytes are normal.  Blood sugars are normal.  Chest x-ray and twelve-lead EKG normal.  Troponin is nonischemic.  Patient received 4 baby aspirin in route to the ER.  Review of Systems: As per HPI otherwise 10 point review of systems negative.    Past Medical History:  Diagnosis Date  . Aortic stenosis   . Arthritis   . Diabetes mellitus without complication (HCC)   . Diverticulosis   . Hypertension   . NSTEMI (non-ST elevated myocardial infarction) (HCC)   . Stroke [facility])     Past Surgical History:  Procedure Laterality Date  . ABDOMINAL SURGERY    . CORONARY STENT INTERVENTION N/A 04/08/2018   Procedure: CORONARY STENT  INTERVENTION;  Surgeon: Tonny Bollman, MD;  Location: [facility] INVASIVE CV LAB;  Service: Cardiovascular;  Laterality: N/A;  . LEFT HEART CATH AND CORONARY ANGIOGRAPHY N/A 04/08/2018   Procedure: LEFT HEART CATH AND CORONARY ANGIOGRAPHY;  Surgeon: Tonny Bollman, MD;  Location: [facility] INVASIVE CV LAB;  Service: Cardiovascular;  Laterality: N/A;     reports that she has never smoked. She has never used smokeless tobacco. She reports that she does not drink alcohol or use drugs.  Allergies  Allergen Reactions  . Penicillins Anaphylaxis    Has patient had a PCN reaction causing immediate rash, facial/tongue/throat swelling, SOB or lightheadedness with hypotension: YES Has patient had a PCN reaction causing severe rash involving mucus membranes or skin necrosis:NO Has patient had a PCN reaction that required hospitalization YES Has patient had a PCN reaction occurring within the last 10 years: NO If all of the above answers are "NO", then may proceed with Cephalosporin use.  . Shrimp [Shellfish Allergy] Anaphylaxis  . Metformin And Related Nausea And Vomiting    Pt trialed Metformin 500 mg BID 09/2015, 2X hx vomiting; unwilling to retry.    Family History  Problem Relation Age of Onset  . Diabetes Mother      Prior to Admission medications   Medication Sig Start Date End Date Taking? Authorizing Provider  aspirin 81 MG EC tablet Take 1 tablet (81 mg total) by mouth daily. 04/10/18   Meccariello, Solmon Ice, DO  carboxymethylcellul-glycerin (OPTIVE) 0.5-0.9 % ophthalmic solution Place 1 drop  into both eyes as needed for dry eyes.    [provider]  glipiZIDE (GLUCOTROL XL) 5 MG 24 hr tablet Take 5 mg by mouth daily with breakfast.    [provider]  lisinopril (PRINIVIL,ZESTRIL) 20 MG tablet Take 1 tablet (20 mg total) by mouth daily. 05/15/18   Berton Bon, NP  lovastatin (MEVACOR) 20 MG tablet Take 20 mg by mouth daily.    [provider]  metoprolol tartrate  (LOPRESSOR) 25 MG tablet Take 1 tablet (25 mg total) by mouth 2 (two) times daily. 04/09/18   Meccariello, Solmon Ice, DO  nitroGLYCERIN (NITROSTAT) 0.4 MG SL tablet Place 1 tablet (0.4 mg total) under the tongue every 5 (five) minutes as needed for chest pain. 04/09/18 04/09/19  Meccariello, Solmon Ice, DO  RABEPRAZOLE SODIUM PO Take by mouth daily.    [provider]  ticagrelor (BRILINTA) 90 MG TABS tablet Take 1 tablet (90 mg total) by mouth 2 (two) times daily. 04/09/18   Meccariello, Solmon Ice, DO    Physical Exam: Vitals:   08/17/18 1145 08/17/18 1200 08/17/18 1215 08/17/18 1230  BP: (!) 189/95 (!) 167/87 (!) 171/78 (!) 173/83  Pulse: 93 80 75 73  Resp: (!) 21 20 19 16   Temp:      TempSrc:      SpO2: 100% 98% 99% 100%  Weight:      Height:        Constitutional: NAD, calm, comfortable Vitals:   08/17/18 1145 08/17/18 1200 08/17/18 1215 08/17/18 1230  BP: (!) 189/95 (!) 167/87 (!) 171/78 (!) 173/83  Pulse: 93 80 75 73  Resp: (!) 21 20 19 16   Temp:      TempSrc:      SpO2: 100% 98% 99% 100%  Weight:      Height:       Eyes: PERRL, lids and conjunctivae normal ENMT: Mucous membranes are moist. Posterior pharynx clear of any exudate or lesions.Normal dentition.  Neck: normal, supple, no masses, no thyromegaly Respiratory: clear to auscultation bilaterally, no wheezing, no crackles. Normal respiratory effort. No accessory muscle use.  Cardiovascular: Regular rate and rhythm, no murmurs / rubs / gallops. No extremity edema. 2+ pedal pulses. No carotid bruits.  Abdomen: no tenderness, no masses palpated. No hepatosplenomegaly. Bowel sounds positive.  Musculoskeletal: no clubbing / cyanosis. No joint deformity upper and lower extremities. Good ROM, no contractures. Normal muscle tone.  Skin: no rashes, lesions, ulcers. No induration Neurologic: CN 2-12 grossly intact. Sensation intact, DTR normal. Strength 5/5 in all 4.  Psychiatric: Normal judgment and insight. Alert and  oriented x 3.  Anxious.    Labs on Admission: I have personally reviewed following labs and imaging studies  CBC: Recent Labs  Lab 08/17/18 0855  WBC 7.5  NEUTROABS 5.1  HGB 13.7  HCT 42.0  MCV 84.2  PLT 308   Basic Metabolic Panel: Recent Labs  Lab 08/17/18 0855  NA 141  K 4.2  CL 107  CO2 21*  GLUCOSE 164*  BUN 19  CREATININE 1.11*  CALCIUM 9.9   GFR: Estimated Creatinine Clearance: 53.4 mL/min (A) (by C-G formula based on SCr of 1.11 mg/dL (H)). Liver Function Tests: Recent Labs  Lab 08/17/18 0855  AST 23  ALT 16  ALKPHOS 118  BILITOT 0.6  PROT 7.8  ALBUMIN 3.8   No results for input(s): LIPASE, AMYLASE in the last 168 hours. No results for input(s): AMMONIA in the last 168 hours. Coagulation Profile: No results for  input(s): INR, PROTIME in the last 168 hours. Cardiac Enzymes: No results for input(s): CKTOTAL, CKMB, CKMBINDEX, TROPONINI in the last 168 hours. BNP (last 3 results) No results for input(s): PROBNP in the last 8760 hours. HbA1C: No results for input(s): HGBA1C in the last 72 hours. CBG: Recent Labs  Lab 08/17/18 1058  GLUCAP 88   Lipid Profile: No results for input(s): CHOL, HDL, LDLCALC, TRIG, CHOLHDL, LDLDIRECT in the last 72 hours. Thyroid Function Tests: No results for input(s): TSH, T4TOTAL, FREET4, T3FREE, THYROIDAB in the last 72 hours. Anemia Panel: No results for input(s): VITAMINB12, FOLATE, FERRITIN, TIBC, IRON, RETICCTPCT in the last 72 hours. Urine analysis:    Component Value Date/Time   COLORURINE YELLOW 08/17/2018 0923   APPEARANCEUR CLEAR 08/17/2018 0923   LABSPEC 1.012 08/17/2018 0923   PHURINE 5.0 08/17/2018 0923   GLUCOSEU NEGATIVE 08/17/2018 0923   HGBUR NEGATIVE 08/17/2018 0923   BILIRUBINUR NEGATIVE 08/17/2018 0923   KETONESUR NEGATIVE 08/17/2018 0923   PROTEINUR NEGATIVE 08/17/2018 0923   UROBILINOGEN 0.2 04/26/2007 1711   NITRITE NEGATIVE 08/17/2018 0923   LEUKOCYTESUR NEGATIVE 08/17/2018 0923     Radiological Exams on Admission: Dg Chest 2 View  Result Date: 08/17/2018 CLINICAL DATA:  Chest pain EXAM: CHEST - 2 VIEW COMPARISON:  Chest radiograph April 07, 2018 and chest CT April 07, 2018 FINDINGS: There is no appreciable edema or consolidation. Heart is mildly enlarged with pulmonary vascularity normal. There is aortic atherosclerosis. No adenopathy. There is degenerative change in the thoracic spine. IMPRESSION: Stable cardiomegaly. Aortic atherosclerosis. No edema or consolidation. Electronically Signed   By: Bretta BangWilliam  Woodruff III M.D.   On: 08/17/2018 09:43    EKG: Independently reviewed.  Sinus rhythm.  No ST-T wave changes.  Similar to previous EKG from October 2019.  Assessment/Plan Principal Problem:   Chest pain Active Problems:   Hyperlipidemia   Aortic stenosis   CAD (coronary artery disease)   Essential (primary) hypertension     1.  Chest pain: We will admit patient to the telemetry unit given severity of symptoms and history of recent drug-eluting stent placement in LAD. Currently chest pain improved. Cycle EKG and troponins. Supplemental oxygen to keep saturations more than 90%.  Received aspirin in route.  Resume her home dose of aspirin and Brilinta.  Nitroglycerin and morphine for recurrent pain.  If she has recurrent pain, will start patient on heparin infusion and also consult cardiology.  2.  Coronary artery disease: With DES LAD.  On dual antiplatelet, on statin beta-blockers and ACE inhibitors that she will continue.  Rule out acute coronary syndrome as above.  3.  Hyperlipidemia: On a statin that she will continue.  4.  Hypertension: Accelerated on presentation.  Resume home medications.  5.  Type 2 diabetes: On glipizide at home.  Continue glipizide.  Add sliding scale insulin.  Fairly controlled as per patient.   DVT prophylaxis: Lovenox subcu. Code Status: Full code. Family Communication: No family at bedside. Disposition Plan:  Home. Consults called: None. Admission status: Telemetry cardiac.   Dorcas CarrowKuber Aalyiah Camberos MD [facility] Pager [phone]  If 7PM-7AM, please contact night-coverage [email or web address] Password [email or web address]ita[facility]  08/17/2018, 12:51 PM

## 2018-08-18 DIAGNOSIS — I719 Aortic aneurysm of unspecified site, without rupture: Secondary | ICD-10-CM | POA: Diagnosis not present

## 2018-08-18 DIAGNOSIS — I10 Essential (primary) hypertension: Secondary | ICD-10-CM | POA: Diagnosis not present

## 2018-08-18 DIAGNOSIS — R079 Chest pain, unspecified: Secondary | ICD-10-CM | POA: Diagnosis not present

## 2018-08-18 DIAGNOSIS — E782 Mixed hyperlipidemia: Secondary | ICD-10-CM | POA: Diagnosis not present

## 2018-08-18 DIAGNOSIS — I2510 Atherosclerotic heart disease of native coronary artery without angina pectoris: Secondary | ICD-10-CM | POA: Diagnosis not present

## 2018-08-18 DIAGNOSIS — I259 Chronic ischemic heart disease, unspecified: Secondary | ICD-10-CM | POA: Diagnosis not present

## 2018-08-18 DIAGNOSIS — E118 Type 2 diabetes mellitus with unspecified complications: Secondary | ICD-10-CM

## 2018-08-18 DIAGNOSIS — I723 Aneurysm of iliac artery: Secondary | ICD-10-CM

## 2018-08-18 LAB — TROPONIN I: Troponin I: <0.03 ng/mL (ref <0.03)

## 2018-08-18 LAB — GLUCOSE, CAPILLARY
Glucose-Capillary: 114 mg/dL — ABNORMAL HIGH (ref 70–99)
Glucose-Capillary: 79 mg/dL (ref 70–99)

## 2018-08-18 MED ORDER — RABEPRAZOLE SODIUM 20 MG PO TBEC: 20.0000 mg | DELAYED_RELEASE_TABLET | Freq: Every day | ORAL | Status: DC

## 2018-08-18 NOTE — Progress Notes (Signed)
Pt discharge education provided at bedside. Pt IV removed, catheter intact and telemetry removed. Pt has all belongings. Pt discharged via wheelchair with NT

## 2018-08-18 NOTE — Progress Notes (Deleted)
Vicki Rogers, is a 73 y.o. female  DOB Nov 22, 1945  MRN [id number].  Admission date:  08/17/2018  Admitting Physician  Dorcas CarrowKuber Ghimire, MD  Discharge Date:  08/18/2018   Primary MD  Jackie Plumsei-Bonsu, George, MD  Recommendations for primary care physician for things to follow:    Discharge Diagnosis    Principal Problem:   Chest pain Active Problems:   Hyperlipidemia   Penetrating ulcer of aorta (HCC)   Type 2 diabetes mellitus with complication, without long-term current use of insulin (HCC)   CAD (coronary artery disease)   Essential (primary) hypertension   Iliac artery aneurysm [facility](HCC)      Past Medical History:  Diagnosis Date  . Aortic stenosis   . Arthritis   . Diabetes mellitus without complication (HCC)   . Diverticulosis   . Hypertension   . NSTEMI (non-ST elevated myocardial infarction) (HCC)   . Stroke [facility](HCC)     Past Surgical History:  Procedure Laterality Date  . ABDOMINAL SURGERY    . CORONARY STENT INTERVENTION N/A 04/08/2018   Procedure: CORONARY STENT INTERVENTION;  Surgeon: Tonny Bollmanooper, Michael, MD;  [facility];  Service: Cardiovascular;  Laterality: N/A;  . LEFT HEART CATH AND CORONARY ANGIOGRAPHY N/A 04/08/2018   Procedure: LEFT HEART CATH AND CORONARY ANGIOGRAPHY;  Surgeon: Tonny Bollmanooper, Michael, MD;  [facility];  Service: Cardiovascular;  Laterality: N/A;       HPI  from the history and physical done on the day of admission:    Vicki Adesileen Manring is a 73 y.o. female with medical history significant of hypertension, diabetes, coronary artery disease, non-STEMI with LAD stent on November 2019 on dual antiplatelet therapy, hyperlipidemia who presents to the emergency room with sudden onset of retrosternal discomfort and chest pain since today morning.  According to the patient, she did very well after stent placement.   She was ready to go for rehab.  Today morning at about 650-7:00, she started with a feeling of gas bubble on her lower chest that felt like gas, traveled retrosternal up into the chest then went towards the left side of the chest at precordium, moderate in intensity, had some nausea and dizziness.  She took a nitroglycerin that helped ease the pain and still has some remaining discomfort on the left precordium.  Denies any fever chills.  Denies any cough.  Denies any flulike symptoms.  No angina symptoms other than today.    ED Course: Hemodynamically stable.  Currently relieved of acute chest pain with some remaining discomfort.  Electrolytes are normal.  Blood sugars are normal.  Chest x-ray and twelve-lead EKG normal.  Troponin is nonischemic.  Patient received 4 baby aspirin in route to the ER.   Hospital Course:  1.  Chest pain: Patient presented after having complaints of an episode of chest pain. EKG showing no significant ischemic changes and serial troponins were negative.   Patient just recently had cardiac catheterization with stent placement on 04/08/2018.  During that time patient had significant elevation in troponins.  Symptoms thought to be less likely cardiac in nature and no further work-up was warranted.  She is followed by Dr. Jacques NavyAcharya of cardiology, who the case was discussed with and will help arrange for outpatient follow-up.  2.  CAD s/p stent: Stable.  Patient with drug-eluting stent to the LAD in 03/2018.  Patient was continued on dual antiplatelet therapy, beta-blocker, statin, and ACE inhibitor.  3.  Essential hypertension: Systolic blood pressures were noted to be elevated into the 180s early in hospital stay, but were noted to be 123/78 prior to discharge.Marland Kitchen.  She was continued on lisinopril and metoprolol.  Patient may need adjustment of blood pressure medications in the outpatient setting.  4.  Diabetes mellitus type 2 : Controlled.  Last hemoglobin A1c noted to be 6.4.   Patient was recommended to continue on current home regimen of glipizide.  5.  Iliac artery aneurysm with atherosclerotic ulcer: Patient seen to have16 x 14 mm penetrating atherosclerotic ulcer is seen arising anteriorly from infrarenal abdominal aorta, and 1.9 cm left common iliac artery aneurysm on CT angiogram performed on 04/07/2018  6.  Hyperlipidemia: Patient was continued on lovastatin.  Will need to continue outpatient monitoring of liver function.  7.  GERD: Stable.  She is on rabeprazole 20 mg daily at home.  Patient was advised to continue on this medication at discharge.  8.  History of CVA: Patient was continued on aspirin   9.  Mild renal insufficiency: Acute on chronic.  Creatinine initially noted to be 1.11 on admission with BUN within normal limits.  Baseline creatinine previously noted to be around 0.9.    Follow UP  Follow-up Information    Parke PoissonAcharya, Gayatri A, MD Follow up.   Specialty:  Cardiology Why:  You should be called with a scheduled appointment sometime next week to be seen in 2-4 weeks.  If not scheduled, please call office and have appointment made to be seen within 2-4 weeks. Contact information: [address] [place] [address] [phone]            Consults obtained: None  Discharge Condition: Stable  Diet and Activity recommendation: See Discharge Instructions below  Discharge Instructions    Discharge instructions   Complete by:  As directed    Follow with Primary MD Jackie Plumsei-Bonsu, George, MD in 7 days.  There did not appear to be any significant signs of heart damage related with your recent episode of chest pain.  Please continue all of your home medications as previously prescribed.  Your cardiologist's has been notified, and they will set you up with their appointment within 2-4 weeks.( we routinely change or add medications that can affect your baseline labs and fluid status, therefore we recommend that you get the  mentioned basic workup next visit with your PCP, your PCP may decide not to get them or add new tests based on their clinical decision)  Activity: As tolerated with  fall precautions use walker/cane & assistance as needed  Disposition: Home   Diet: Heart Healthy         For Heart failure patients - Check your Weight same time everyday, if you gain over 2 pounds, or you develop in  leg swelling, experience more shortness of breath or chest pain, call your Primary doctor immediately. Follow Cardiac Low Salt Diet and 1.5 lit/day fluid restriction.  Special Instructions: If you have smoked or chewed Tobacco  in the last 2 yrs please stop smoking, stop any regular Alcohol  and or any Recreational drug use.  On your next visit with your primary care physician please Get Medicines reviewed and adjusted.  Please request your Osei-Bonsu, Greggory Stallion, MD to go over all Hospital Tests and Procedure/Radiological results at the follow up, please get all Hospital records sent to your Prim MD by signing hospital release before you go home.  If you experience worsening of your admission symptoms, develop shortness of breath, life threatening emergency, suicidal or homicidal thoughts you must seek medical attention immediately by calling 911 or calling your MD immediately  if symptoms less severe.  You Must read complete instructions/literature along with all the possible adverse reactions/side effects for all the Medicines you take and that have been prescribed to you. Take any new Medicines after you have completely understood and accpet all the possible adverse reactions/side effects.   Do not drive, operate heavy machinery, perform activities at heights, swimming or participation in water activities or provide baby sitting services if your were admitted for syncope or siezures until you have seen by Primary MD or a Neurologist and advised to do so again.  Do not drive when taking Pain medications.  Do not take  more than prescribed Pain, Sleep and Anxiety Medications  Wear Seat belts while driving.   Please note  You were cared for by a hospitalist during your hospital stay. If you have any questions about your discharge medications or the care you received while you were in the hospital after you are discharged, you can call the unit and asked to speak with the hospitalist on call if the hospitalist that took care of you is not available. Once you are discharged, your primary care physician will handle any further medical issues. Please note that NO REFILLS for any discharge medications will be authorized once you are discharged, as it is imperative that you return to your primary care physician (or establish a relationship with a primary care physician if you do not have one) for your aftercare needs so that they can reassess your need for medications and monitor your lab values. \        Discharge Medications     Allergies as of 08/18/2018      Reactions   Penicillins Anaphylaxis   Has patient had a PCN reaction causing immediate rash, facial/tongue/throat swelling, SOB or lightheadedness with hypotension: YES Has patient had a PCN reaction causing severe rash involving mucus membranes or skin necrosis:NO Has patient had a PCN reaction that required hospitalization YES Has patient had a PCN reaction occurring within the last 10 years: NO If all of the above answers are "NO", then may proceed with Cephalosporin use.   Shrimp [shellfish Allergy] Anaphylaxis   Metformin And Related Nausea And Vomiting   Pt trialed Metformin 500 mg BID 09/2015, 2X hx vomiting; unwilling to retry.      Medication List    TAKE these medications   aspirin 81 MG EC tablet Take 1 tablet (81 mg total) by mouth daily.   glipiZIDE 5 MG 24 hr tablet Commonly known as:  GLUCOTROL XL Take 5 mg by mouth daily with breakfast.   lisinopril 20 MG tablet Commonly known as:  PRINIVIL,ZESTRIL Take 1 tablet (20  mg total)  by mouth daily.   lovastatin 20 MG tablet Commonly known as:  MEVACOR Take 20 mg by mouth daily.   metoprolol tartrate 25 MG tablet Commonly known as:  LOPRESSOR Take 1 tablet (25 mg total) by mouth 2 (two) times daily.   nitroGLYCERIN 0.4 MG SL tablet Commonly known as:  NITROSTAT Place 1 tablet (0.4 mg total) under the tongue every 5 (five) minutes as needed for chest pain.   OPTIVE 0.5-0.9 % ophthalmic solution Generic drug:  carboxymethylcellul-glycerin Place 1 drop into both eyes as needed for dry eyes.   RABEprazole 20 MG tablet Commonly known as:  ACIPHEX Take 1 tablet (20 mg total) by mouth daily. What changed:    medication strength  how much to take   ticagrelor 90 MG Tabs tablet Commonly known as:  BRILINTA Take 1 tablet (90 mg total) by mouth 2 (two) times daily.       Major procedures and Radiology Reports - PLEASE review detailed and final reports for all details, in brief -      Dg Chest 2 View  Result Date: 08/17/2018 CLINICAL DATA:  Chest pain EXAM: CHEST - 2 VIEW COMPARISON:  Chest radiograph April 07, 2018 and chest CT April 07, 2018 FINDINGS: There is no appreciable edema or consolidation. Heart is mildly enlarged with pulmonary vascularity normal. There is aortic atherosclerosis. No adenopathy. There is degenerative change in the thoracic spine. IMPRESSION: Stable cardiomegaly. Aortic atherosclerosis. No edema or consolidation. Electronically Signed   By: Bretta Bang III M.D.   On: 08/17/2018 09:43    Micro Results   No results found for this or any previous visit (from the past 240 hour(s)).     Today   Subjective    Nolan Sandefer today states that she has had no recurrence of chest pain since being in the hospital.   Objective   Blood pressure 123/78, pulse 65, temperature 98.3 F (36.8 C), temperature source Oral, resp. rate 19, height 5\' 4"  (1.626 m), weight 99.8 kg, SpO2 98 %.   Intake/Output Summary (Last 24 hours)  at 08/18/2018 1422 Last data filed at 08/18/2018 1310 Gross per 24 hour  Intake 960 ml  Output -  Net 960 ml    Exam  Constitutional: Elderly female in NAD, calm, comfortable Eyes: PERRL, lids and conjunctivae normal ENMT: Mucous membranes are moist. Posterior pharynx clear of any exudate or lesions.  Neck: normal, supple, no masses, no thyromegaly Respiratory: clear to auscultation bilaterally, no wheezing, no crackles. Normal respiratory effort. No accessory muscle use.  Cardiovascular: Regular rate and rhythm, no murmurs / rubs / gallops. No extremity edema. 2+ pedal pulses. No carotid bruits.  Abdomen: no tenderness, no masses palpated. No hepatosplenomegaly. Bowel sounds positive.  Musculoskeletal: no clubbing / cyanosis. No joint deformity upper and lower extremities. Good ROM, no contractures. Normal muscle tone.  Skin: no rashes, lesions, ulcers. No induration Neurologic: CN 2-12 grossly intact. Sensation intact, DTR normal. Strength 5/5 in all 4.  Psychiatric: Normal judgment and insight. Alert and oriented x 3. Normal mood.    Data Review   CBC w Diff:  Lab Results  Component Value Date   WBC 7.5 08/17/2018   HGB 13.7 08/17/2018   HCT 42.0 08/17/2018   PLT 308 08/17/2018   LYMPHOPCT 23 08/17/2018   MONOPCT 5 08/17/2018   EOSPCT 2 08/17/2018   BASOPCT 1 08/17/2018    CMP:  Lab Results  Component Value Date   NA 141 08/17/2018  NA 143 05/14/2018   K 4.2 08/17/2018   CL 107 08/17/2018   CO2 21 (L) 08/17/2018   BUN 19 08/17/2018   BUN 15 05/14/2018   CREATININE 1.11 (H) 08/17/2018   PROT 7.8 08/17/2018   PROT 6.8 06/11/2018   ALBUMIN 3.8 08/17/2018   ALBUMIN 4.0 06/11/2018   BILITOT 0.6 08/17/2018   BILITOT 0.4 06/11/2018   ALKPHOS 118 08/17/2018   AST 23 08/17/2018   ALT 16 08/17/2018  .   Total Time in preparing paper work, data evaluation and todays exam - 35 minutes  Clydie Braun M.D on 08/18/2018 at 2:22 PM  [facility]   Office   [phone]

## 2018-08-19 ENCOUNTER — Ambulatory Visit (HOSPITAL_COMMUNITY): Payer: Medicare HMO

## 2018-08-19 DIAGNOSIS — I723 Aneurysm of iliac artery: Secondary | ICD-10-CM | POA: Diagnosis present

## 2018-08-19 NOTE — Discharge Summary (Addendum)
Vicki Rogers, is a 73 y.o. female  DOB Nov 22, 1945  MRN [id number].  Admission date:  08/17/2018  Admitting Physician  Dorcas CarrowKuber Ghimire, MD  Discharge Date:  08/18/2018   Primary MD  Jackie Plumsei-Bonsu, George, MD  Recommendations for primary care physician for things to follow:    Discharge Diagnosis    Principal Problem:   Chest pain Active Problems:   Hyperlipidemia   Penetrating ulcer of aorta (HCC)   Type 2 diabetes mellitus with complication, without long-term current use of insulin (HCC)   CAD (coronary artery disease)   Essential (primary) hypertension   Iliac artery aneurysm [facility](HCC)      Past Medical History:  Diagnosis Date  . Aortic stenosis   . Arthritis   . Diabetes mellitus without complication (HCC)   . Diverticulosis   . Hypertension   . NSTEMI (non-ST elevated myocardial infarction) (HCC)   . Stroke [facility](HCC)     Past Surgical History:  Procedure Laterality Date  . ABDOMINAL SURGERY    . CORONARY STENT INTERVENTION N/A 04/08/2018   Procedure: CORONARY STENT INTERVENTION;  Surgeon: Tonny Bollmanooper, Michael, MD;  [facility];  Service: Cardiovascular;  Laterality: N/A;  . LEFT HEART CATH AND CORONARY ANGIOGRAPHY N/A 04/08/2018   Procedure: LEFT HEART CATH AND CORONARY ANGIOGRAPHY;  Surgeon: Tonny Bollmanooper, Michael, MD;  [facility];  Service: Cardiovascular;  Laterality: N/A;       HPI  from the history and physical done on the day of admission:    Vicki Rogers is a 73 y.o. female with medical history significant of hypertension, diabetes, coronary artery disease, non-STEMI with LAD stent on November 2019 on dual antiplatelet therapy, hyperlipidemia who presents to the emergency room with sudden onset of retrosternal discomfort and chest pain since today morning.  According to the patient, she did very well after stent placement.   She was ready to go for rehab.  Today morning at about 650-7:00, she started with a feeling of gas bubble on her lower chest that felt like gas, traveled retrosternal up into the chest then went towards the left side of the chest at precordium, moderate in intensity, had some nausea and dizziness.  She took a nitroglycerin that helped ease the pain and still has some remaining discomfort on the left precordium.  Denies any fever chills.  Denies any cough.  Denies any flulike symptoms.  No angina symptoms other than today.    ED Course: Hemodynamically stable.  Currently relieved of acute chest pain with some remaining discomfort.  Electrolytes are normal.  Blood sugars are normal.  Chest x-ray and twelve-lead EKG normal.  Troponin is nonischemic.  Patient received 4 baby aspirin in route to the ER.   Hospital Course:  1.  Chest pain: Patient presented after having complaints of an episode of chest pain. EKG showing no significant ischemic changes and serial troponins were negative.   Patient just recently had cardiac catheterization with stent placement on 04/08/2018.  During that time patient had significant elevation in troponins.  Symptoms thought to be less likely cardiac in nature and no further work-up was warranted.  She is followed by Dr. Jacques NavyAcharya of cardiology, who the case was discussed with and will help arrange for outpatient follow-up.  2.  CAD s/p stent: Stable.  Patient with drug-eluting stent to the LAD in 03/2018.  Patient was continued on dual antiplatelet therapy, beta-blocker, statin, and ACE inhibitor.  3.  Essential hypertension: Systolic blood pressures were noted to be elevated into the 180s early in hospital stay, but were noted to be 123/78 prior to discharge.Marland Kitchen.  She was continued on lisinopril and metoprolol.  Patient may need adjustment of blood pressure medications in the outpatient setting.  4.  Diabetes mellitus type 2 : Controlled.  Last hemoglobin A1c noted to be 6.4.   Patient was recommended to continue on current home regimen of glipizide.  5.  Iliac artery aneurysm with atherosclerotic ulcer: Patient seen to have16 x 14 mm penetrating atherosclerotic ulcer is seen arising anteriorly from infrarenal abdominal aorta, and 1.9 cm left common iliac artery aneurysm on CT angiogram performed on 04/07/2018  6.  Hyperlipidemia: Patient was continued on lovastatin.  Will need to continue outpatient monitoring of liver function.  7.  GERD: Stable.  She is on rabeprazole 20 mg daily at home.  Patient was advised to continue on this medication at discharge.  8.  History of CVA: Patient was continued on aspirin   9.  Mild renal insufficiency: Acute on chronic.  Creatinine initially noted to be 1.11 on admission with BUN within normal limits.  Baseline creatinine previously noted to be around 0.9.    Follow UP  Follow-up Information    Parke PoissonAcharya, Gayatri A, MD Follow up.   Specialty:  Cardiology Why:  You should be called with a scheduled appointment sometime next week to be seen in 2-4 weeks.  If not scheduled, please call office and have appointment made to be seen within 2-4 weeks. Contact information: [address] [place] [id number] [phone]            Consults obtained: None  Discharge Condition: Stable  Diet and Activity recommendation: See Discharge Instructions below  Discharge Instructions    Discharge instructions   Complete by:  As directed    Follow with Primary MD Jackie Plumsei-Bonsu, George, MD in 7 days.  There did not appear to be any significant signs of heart damage related with your recent episode of chest pain.  Please continue all of your home medications as previously prescribed.  Your cardiologist's has been notified, and they will set you up with their appointment within 2-4 weeks.( we routinely change or add medications that can affect your baseline labs and fluid status, therefore we recommend that you get the  mentioned basic workup next visit with your PCP, your PCP may decide not to get them or add new tests based on their clinical decision)  Activity: As tolerated with  fall precautions use walker/cane & assistance as needed  Disposition: Home   Diet: Heart Healthy         For Heart failure patients - Check your Weight same time everyday, if you gain over 2 pounds, or you develop in  leg swelling, experience more shortness of breath or chest pain, call your Primary doctor immediately. Follow Cardiac Low Salt Diet and 1.5 lit/day fluid restriction.  Special Instructions: If you have smoked or chewed Tobacco  in the last 2 yrs please stop smoking, stop any regular Alcohol  and or any Recreational drug use.  On your next visit with your primary care physician please Get Medicines reviewed and adjusted.  Please request your Osei-Bonsu, Greggory Stallion, MD to go over all Hospital Tests and Procedure/Radiological results at the follow up, please get all Hospital records sent to your Prim MD by signing hospital release before you go home.  If you experience worsening of your admission symptoms, develop shortness of breath, life threatening emergency, suicidal or homicidal thoughts you must seek medical attention immediately by calling 911 or calling your MD immediately  if symptoms less severe.  You Must read complete instructions/literature along with all the possible adverse reactions/side effects for all the Medicines you take and that have been prescribed to you. Take any new Medicines after you have completely understood and accpet all the possible adverse reactions/side effects.   Do not drive, operate heavy machinery, perform activities at heights, swimming or participation in water activities or provide baby sitting services if your were admitted for syncope or siezures until you have seen by Primary MD or a Neurologist and advised to do so again.  Do not drive when taking Pain medications.  Do not take  more than prescribed Pain, Sleep and Anxiety Medications  Wear Seat belts while driving.   Please note  You were cared for by a hospitalist during your hospital stay. If you have any questions about your discharge medications or the care you received while you were in the hospital after you are discharged, you can call the unit and asked to speak with the hospitalist on call if the hospitalist that took care of you is not available. Once you are discharged, your primary care physician will handle any further medical issues. Please note that NO REFILLS for any discharge medications will be authorized once you are discharged, as it is imperative that you return to your primary care physician (or establish a relationship with a primary care physician if you do not have one) for your aftercare needs so that they can reassess your need for medications and monitor your lab values. \        Discharge Medications     Allergies as of 08/18/2018      Reactions   Penicillins Anaphylaxis   Has patient had a PCN reaction causing immediate rash, facial/tongue/throat swelling, SOB or lightheadedness with hypotension: YES Has patient had a PCN reaction causing severe rash involving mucus membranes or skin necrosis:NO Has patient had a PCN reaction that required hospitalization YES Has patient had a PCN reaction occurring within the last 10 years: NO If all of the above answers are "NO", then may proceed with Cephalosporin use.   Shrimp [shellfish Allergy] Anaphylaxis   Metformin And Related Nausea And Vomiting   Pt trialed Metformin 500 mg BID 09/2015, 2X hx vomiting; unwilling to retry.      Medication List    TAKE these medications   aspirin 81 MG EC tablet Take 1 tablet (81 mg total) by mouth daily.   glipiZIDE 5 MG 24 hr tablet Commonly known as:  GLUCOTROL XL Take 5 mg by mouth daily with breakfast.   lisinopril 20 MG tablet Commonly known as:  PRINIVIL,ZESTRIL Take 1 tablet (20  mg total)  by mouth daily.   lovastatin 20 MG tablet Commonly known as:  MEVACOR Take 20 mg by mouth daily.   metoprolol tartrate 25 MG tablet Commonly known as:  LOPRESSOR Take 1 tablet (25 mg total) by mouth 2 (two) times daily.   nitroGLYCERIN 0.4 MG SL tablet Commonly known as:  NITROSTAT Place 1 tablet (0.4 mg total) under the tongue every 5 (five) minutes as needed for chest pain.   OPTIVE 0.5-0.9 % ophthalmic solution Generic drug:  carboxymethylcellul-glycerin Place 1 drop into both eyes as needed for dry eyes.   RABEprazole 20 MG tablet Commonly known as:  ACIPHEX Take 1 tablet (20 mg total) by mouth daily. What changed:    medication strength  how much to take   ticagrelor 90 MG Tabs tablet Commonly known as:  BRILINTA Take 1 tablet (90 mg total) by mouth 2 (two) times daily.       Major procedures and Radiology Reports - PLEASE review detailed and final reports for all details, in brief -      Dg Chest 2 View  Result Date: 08/17/2018 CLINICAL DATA:  Chest pain EXAM: CHEST - 2 VIEW COMPARISON:  Chest radiograph April 07, 2018 and chest CT April 07, 2018 FINDINGS: There is no appreciable edema or consolidation. Heart is mildly enlarged with pulmonary vascularity normal. There is aortic atherosclerosis. No adenopathy. There is degenerative change in the thoracic spine. IMPRESSION: Stable cardiomegaly. Aortic atherosclerosis. No edema or consolidation. Electronically Signed   By: Bretta Bang III M.D.   On: 08/17/2018 09:43    Micro Results   No results found for this or any previous visit (from the past 240 hour(s)).     Today   Subjective    Juliaette Agate today states that she has had no recurrence of chest pain since being in the hospital.   Objective   Blood pressure 123/78, pulse 65, temperature 98.3 F (36.8 C), temperature source Oral, resp. rate 19, height 5\' 4"  (1.626 m), weight 99.8 kg, SpO2 98 %.   Intake/Output Summary (Last 24 hours)  at 08/19/2018 0622 Last data filed at 08/18/2018 1310 Gross per 24 hour  Intake 720 ml  Output -  Net 720 ml    Exam  Constitutional: Elderly female in NAD, calm, comfortable Eyes: PERRL, lids and conjunctivae normal ENMT: Mucous membranes are moist. Posterior pharynx clear of any exudate or lesions.  Neck: normal, supple, no masses, no thyromegaly Respiratory: clear to auscultation bilaterally, no wheezing, no crackles. Normal respiratory effort. No accessory muscle use.  Cardiovascular: Regular rate and rhythm, no murmurs / rubs / gallops. No extremity edema. 2+ pedal pulses. No carotid bruits.  Abdomen: no tenderness, no masses palpated. No hepatosplenomegaly. Bowel sounds positive.  Musculoskeletal: no clubbing / cyanosis. No joint deformity upper and lower extremities. Good ROM, no contractures. Normal muscle tone.  Skin: no rashes, lesions, ulcers. No induration Neurologic: CN 2-12 grossly intact. Sensation intact, DTR normal. Strength 5/5 in all 4.  Psychiatric: Normal judgment and insight. Alert and oriented x 3. Normal mood.    Data Review   CBC w Diff:  Lab Results  Component Value Date   WBC 7.5 08/17/2018   HGB 13.7 08/17/2018   HCT 42.0 08/17/2018   PLT 308 08/17/2018   LYMPHOPCT 23 08/17/2018   MONOPCT 5 08/17/2018   EOSPCT 2 08/17/2018   BASOPCT 1 08/17/2018    CMP:  Lab Results  Component Value Date   NA 141 08/17/2018  NA 143 05/14/2018   K 4.2 08/17/2018   CL 107 08/17/2018   CO2 21 (L) 08/17/2018   BUN 19 08/17/2018   BUN 15 05/14/2018   CREATININE 1.11 (H) 08/17/2018   PROT 7.8 08/17/2018   PROT 6.8 06/11/2018   ALBUMIN 3.8 08/17/2018   ALBUMIN 4.0 06/11/2018   BILITOT 0.6 08/17/2018   BILITOT 0.4 06/11/2018   ALKPHOS 118 08/17/2018   AST 23 08/17/2018   ALT 16 08/17/2018  .   Total Time in preparing paper work, data evaluation and todays exam - 35 minutes  Clydie Braunondell A Smith M.D on 08/19/2018 at 6:22 AM  [facility]   Office   [phone]

## 2018-08-21 ENCOUNTER — Ambulatory Visit (HOSPITAL_COMMUNITY): Payer: Medicare HMO

## 2018-08-23 ENCOUNTER — Ambulatory Visit (HOSPITAL_COMMUNITY): Payer: Medicare HMO

## 2018-08-26 ENCOUNTER — Ambulatory Visit (HOSPITAL_COMMUNITY): Payer: Medicare HMO

## 2018-08-28 ENCOUNTER — Encounter: Payer: Self-pay | Admitting: Cardiology

## 2018-08-28 ENCOUNTER — Ambulatory Visit (HOSPITAL_COMMUNITY): Payer: Medicare HMO

## 2018-08-30 ENCOUNTER — Ambulatory Visit (HOSPITAL_COMMUNITY): Payer: Medicare HMO

## 2018-09-02 ENCOUNTER — Ambulatory Visit (HOSPITAL_COMMUNITY): Payer: Medicare HMO

## 2018-09-04 ENCOUNTER — Ambulatory Visit (HOSPITAL_COMMUNITY): Payer: Medicare HMO

## 2018-09-06 ENCOUNTER — Ambulatory Visit (HOSPITAL_COMMUNITY): Payer: Medicare HMO

## 2018-09-09 ENCOUNTER — Ambulatory Visit (HOSPITAL_COMMUNITY): Payer: Medicare HMO

## 2018-09-11 ENCOUNTER — Ambulatory Visit (HOSPITAL_COMMUNITY): Payer: Medicare HMO

## 2018-09-13 ENCOUNTER — Ambulatory Visit (HOSPITAL_COMMUNITY): Payer: Medicare HMO

## 2018-09-16 ENCOUNTER — Ambulatory Visit (HOSPITAL_COMMUNITY): Payer: Medicare HMO

## 2018-09-17 ENCOUNTER — Ambulatory Visit: Payer: Medicare HMO | Admitting: Cardiology

## 2018-09-17 NOTE — Progress Notes (Deleted)
Cardiology Office Note:    Date:  09/17/2018   ID:  Vicki Rogers, DOB [birth year], MRN [id number]  PCP:  Jackie Plum, MD  Cardiologist:  Parke Poisson, MD  Referring MD: Jackie Plum, MD   No chief complaint on file. ***  History of Present Illness:    Vicki Rogers is a 73 y.o. female with a past medical history significant for diabetes type 2, diverticulosis, hypertension, stroke and CAD s/p N STEMI with DES to LAD 03/2018.  Echocardiogram 04/09/2018 showed preserved LV systolic function with EF 60-65%, grade 2 diastolic dysfunction.  [name] was admitted to the hospital 08/17/2018- 08/18/2018 for evaluation of chest pain.  EKG was nonischemic and serial troponins were negative.  Her chest pain was felt to be noncardiac related.  Blood pressure was elevated in the 180s during her hospital stay but normalized at discharge.  She was continued on lisinopril and metoprolol.  Past Medical History:  Diagnosis Date  . Aortic stenosis   . Arthritis   . Diabetes mellitus without complication (HCC)   . Diverticulosis   . Hypertension   . NSTEMI (non-ST elevated myocardial infarction) (HCC)   . Stroke [facility])     Past Surgical History:  Procedure Laterality Date  . ABDOMINAL SURGERY    . CORONARY STENT INTERVENTION N/A 04/08/2018   Procedure: CORONARY STENT INTERVENTION;  Surgeon: Tonny Bollman, MD;  Location: [facility] INVASIVE CV LAB;  Service: Cardiovascular;  Laterality: N/A;  . LEFT HEART CATH AND CORONARY ANGIOGRAPHY N/A 04/08/2018   Procedure: LEFT HEART CATH AND CORONARY ANGIOGRAPHY;  Surgeon: Tonny Bollman, MD;  Location: [facility] INVASIVE CV LAB;  Service: Cardiovascular;  Laterality: N/A;    Current Medications: No outpatient medications have been marked as taking for the 09/17/18 encounter (Appointment) with Berton Bon, NP.     Allergies:   Penicillins; Shrimp [shellfish allergy]; and Metformin and related   Social History   Socioeconomic History  . Marital status:  Widowed    Spouse name: Not on file  . Number of children: Not on file  . Years of education: Not on file  . Highest education level: Not on file  Occupational History  . Not on file  Social Needs  . Financial resource strain: Not on file  . Food insecurity:    Worry: Not on file    Inability: Not on file  . Transportation needs:    Medical: Not on file    Non-medical: Not on file  Tobacco Use  . Smoking status: Never Smoker  . Smokeless tobacco: Never Used  Substance and Sexual Activity  . Alcohol use: No  . Drug use: No  . Sexual activity: Not on file  Lifestyle  . Physical activity:    Days per week: Not on file    Minutes per session: Not on file  . Stress: Not on file  Relationships  . Social connections:    Talks on phone: Not on file    Gets together: Not on file    Attends religious service: Not on file    Active member of club or organization: Not on file    Attends meetings of clubs or organizations: Not on file    Relationship status: Not on file  Other Topics Concern  . Not on file  Social History Narrative  . Not on file     Family History: The patient's ***family history includes Diabetes in her mother. ROS:   Please see the history of present illness.    ***  All other systems reviewed and are negative.  EKGs/Labs/Other Studies Reviewed:    The following studies were reviewed today: ***  EKG:  EKG is *** ordered today.  The ekg ordered today demonstrates ***  Recent Labs: 04/07/2018: TSH 1.943 08/17/2018: ALT 16; BUN 19; Creatinine, Ser 1.11; Hemoglobin 13.7; Platelets 308; Potassium 4.2; Sodium 141   Recent Lipid Panel    Component Value Date/Time   CHOL 134 06/11/2018 0932   TRIG 62 06/11/2018 0932   HDL 57 06/11/2018 0932   CHOLHDL 2.4 06/11/2018 0932   CHOLHDL 3.9 04/07/2018 2321   VLDL 11 04/07/2018 2321   LDLCALC 65 06/11/2018 0932    Physical Exam:    VS:  There were no vitals taken for this visit.    Wt Readings from Last 3  Encounters:  08/17/18 220 lb (99.8 kg)  05/14/18 225 lb (102.1 kg)  04/07/18 208 lb 6.4 oz (94.5 kg)     Physical Exam***   ASSESSMENT:    No diagnosis found. PLAN:    In order of problems listed above:  CAD s/p NSTEMI & DES to LAD 03/2018 -Admitted to the hospital last month for evaluation of chest pain felt to be noncardiac related. -Continues on dual antiplatelet therapy, beta-blocker, statin and ACE inhibitor.  Hypertension -On lisinopril, metoprolol  Diabetes type 2 -Last A1c was 6.4.  Management per PCP, continue current therapy.   Hyperlipidemia -Lipid panel in December showed LDL down to 65 from 171.  This is at goal of less than 70.  Continue current therapy.  Iliac artery aneurysm and atherosclerotic ulcer -Patient found to have 16 x 14 mm penetrating atherosclerotic ulcer.   Medication Adjustments/Labs and Tests Ordered: Current medicines are reviewed at length with the patient today.  Concerns regarding medicines are outlined above. Labs and tests ordered and medication changes are outlined in the patient instructions below:  There are no Patient Instructions on file for this visit.   Signed, Berton Bon, NP  09/17/2018 4:41 AM    [facility]

## 2018-09-18 ENCOUNTER — Ambulatory Visit (HOSPITAL_COMMUNITY): Payer: Medicare HMO

## 2018-09-18 ENCOUNTER — Encounter: Payer: Self-pay | Admitting: Cardiology

## 2018-09-20 ENCOUNTER — Ambulatory Visit (HOSPITAL_COMMUNITY): Payer: Medicare HMO

## 2018-09-23 ENCOUNTER — Ambulatory Visit (HOSPITAL_COMMUNITY): Payer: Medicare HMO

## 2018-09-23 DIAGNOSIS — I10 Essential (primary) hypertension: Secondary | ICD-10-CM | POA: Diagnosis not present

## 2018-09-23 DIAGNOSIS — E119 Type 2 diabetes mellitus without complications: Secondary | ICD-10-CM | POA: Diagnosis not present

## 2018-09-23 DIAGNOSIS — K219 Gastro-esophageal reflux disease without esophagitis: Secondary | ICD-10-CM | POA: Diagnosis not present

## 2018-09-23 DIAGNOSIS — F329 Major depressive disorder, single episode, unspecified: Secondary | ICD-10-CM | POA: Diagnosis not present

## 2018-09-23 DIAGNOSIS — E785 Hyperlipidemia, unspecified: Secondary | ICD-10-CM | POA: Diagnosis not present

## 2018-09-23 DIAGNOSIS — I2510 Atherosclerotic heart disease of native coronary artery without angina pectoris: Secondary | ICD-10-CM | POA: Diagnosis not present

## 2018-09-23 DIAGNOSIS — E559 Vitamin D deficiency, unspecified: Secondary | ICD-10-CM | POA: Diagnosis not present

## 2018-09-23 DIAGNOSIS — R040 Epistaxis: Secondary | ICD-10-CM | POA: Diagnosis not present

## 2018-09-23 DIAGNOSIS — M545 Low back pain: Secondary | ICD-10-CM | POA: Diagnosis not present

## 2018-09-25 ENCOUNTER — Ambulatory Visit (HOSPITAL_COMMUNITY): Payer: Medicare HMO

## 2018-09-27 ENCOUNTER — Ambulatory Visit (HOSPITAL_COMMUNITY): Payer: Medicare HMO

## 2018-09-30 ENCOUNTER — Ambulatory Visit (HOSPITAL_COMMUNITY): Payer: Medicare HMO

## 2018-10-02 ENCOUNTER — Ambulatory Visit (HOSPITAL_COMMUNITY): Payer: Medicare HMO

## 2018-10-04 ENCOUNTER — Ambulatory Visit (HOSPITAL_COMMUNITY): Payer: Medicare HMO

## 2018-10-07 ENCOUNTER — Ambulatory Visit (HOSPITAL_COMMUNITY): Payer: Medicare HMO

## 2018-10-09 ENCOUNTER — Ambulatory Visit (HOSPITAL_COMMUNITY): Payer: Medicare HMO

## 2018-10-10 ENCOUNTER — Encounter (HOSPITAL_COMMUNITY): Payer: Medicare HMO

## 2018-10-10 ENCOUNTER — Encounter: Payer: Medicare HMO | Admitting: Vascular Surgery

## 2018-10-11 ENCOUNTER — Ambulatory Visit (HOSPITAL_COMMUNITY): Payer: Medicare HMO

## 2018-10-14 ENCOUNTER — Ambulatory Visit (HOSPITAL_COMMUNITY): Payer: Medicare HMO

## 2018-10-16 ENCOUNTER — Ambulatory Visit (HOSPITAL_COMMUNITY): Payer: Medicare HMO

## 2018-10-18 ENCOUNTER — Ambulatory Visit (HOSPITAL_COMMUNITY): Payer: Medicare HMO

## 2018-10-21 ENCOUNTER — Ambulatory Visit (HOSPITAL_COMMUNITY): Payer: Medicare HMO

## 2018-10-23 ENCOUNTER — Ambulatory Visit (HOSPITAL_COMMUNITY): Payer: Medicare HMO

## 2018-10-25 ENCOUNTER — Ambulatory Visit (HOSPITAL_COMMUNITY): Payer: Medicare HMO

## 2018-10-28 ENCOUNTER — Ambulatory Visit (HOSPITAL_COMMUNITY): Payer: Medicare HMO

## 2018-10-30 ENCOUNTER — Ambulatory Visit (HOSPITAL_COMMUNITY): Payer: Medicare HMO

## 2019-01-14 ENCOUNTER — Telehealth: Payer: Self-pay | Admitting: *Deleted

## 2019-01-14 NOTE — Telephone Encounter (Signed)
Unable to leave a message, no voicemail.  

## 2019-02-07 ENCOUNTER — Telehealth: Payer: Self-pay | Admitting: Internal Medicine

## 2019-02-07 NOTE — Telephone Encounter (Signed)

## 2019-02-10 ENCOUNTER — Encounter: Payer: Self-pay | Admitting: Internal Medicine

## 2019-02-10 ENCOUNTER — Other Ambulatory Visit: Payer: Self-pay

## 2019-02-10 ENCOUNTER — Telehealth (INDEPENDENT_AMBULATORY_CARE_PROVIDER_SITE_OTHER): Payer: Medicare HMO | Admitting: Internal Medicine

## 2019-02-10 VITALS — BP 192/103 | HR 85 | Ht 64.0 in | Wt 220.0 lb

## 2019-02-10 DIAGNOSIS — E118 Type 2 diabetes mellitus with unspecified complications: Secondary | ICD-10-CM | POA: Diagnosis not present

## 2019-02-10 DIAGNOSIS — I2510 Atherosclerotic heart disease of native coronary artery without angina pectoris: Secondary | ICD-10-CM | POA: Diagnosis not present

## 2019-02-10 DIAGNOSIS — E669 Obesity, unspecified: Secondary | ICD-10-CM

## 2019-02-10 DIAGNOSIS — I10 Essential (primary) hypertension: Secondary | ICD-10-CM

## 2019-02-10 DIAGNOSIS — I350 Nonrheumatic aortic (valve) stenosis: Secondary | ICD-10-CM | POA: Diagnosis not present

## 2019-02-10 DIAGNOSIS — E782 Mixed hyperlipidemia: Secondary | ICD-10-CM | POA: Diagnosis not present

## 2019-02-10 MED ORDER — AMLODIPINE BESYLATE 5 MG PO TABS: 5.0000 mg | ORAL_TABLET | Freq: Every day | ORAL | 3 refills | Status: DC

## 2019-02-10 NOTE — Patient Instructions (Addendum)
Medication Instructions:  Dr Margaretann Loveless has recommended making the following medication changes: 1. START Amlodipine 5 mg - take 1 tablet by mouth daily  Your physician recommends that you schedule a follow-up appointment in 2-3 weeks with one of our clinical pharmacists in our hypertension clinic.  If you need a refill on your cardiac medications before your next appointment, please call your pharmacy.   Follow-Up: At [facility], you and your health needs are our priority.  As part of our continuing mission to provide you with exceptional heart care, we have created designated Provider Care Teams.  These Care Teams include your primary Cardiologist (physician) and Advanced Practice Providers (APPs -  Physician Assistants and Nurse Practitioners) who all work together to provide you with the care you need, when you need it. You will need a follow up appointment in 5 months.  Please call our office 2 months in advance to schedule this appointment.  You may see Elouise Munroe, MD or one of the following Advanced Practice Providers on your designated Care Team:   Rosaria Ferries, PA-C . Jory Sims, DNP, ANP . You will receive a reminder letter in the mail two months in advance. If you don't receive a letter, please call our office to schedule the follow-up appointment.

## 2019-02-10 NOTE — Progress Notes (Signed)
Virtual Visit via Telephone Note   This visit type was conducted due to national recommendations for restrictions regarding the COVID-19 Pandemic (e.g. social distancing) in an effort to limit this patient's exposure and mitigate transmission in our community.  Due to her co-morbid illnesses, this patient is at least at moderate risk for complications without adequate follow up.  This format is felt to be most appropriate for this patient at this time.  The patient did not have access to video technology/had technical difficulties with video requiring transitioning to audio format only (telephone).  All issues noted in this document were discussed and addressed.  No physical exam could be performed with this format.  Please refer to the patient's chart for her  consent to telehealth for [facility].   Date:  02/10/2019   ID:  Vicki Rogers, DOB 07-Nov-1945, MRN [id number]  Patient Location: Home Provider Location: Home  PCP:  Benito Mccreedy, MD  Cardiologist:  Elouise Munroe, MD  Electrophysiologist:  None   Evaluation Performed:  Follow-Up Visit  Chief Complaint:  F/u NSTEMI, HTN  History of Present Illness:    Vicki Rogers is a 73 y.o. female with diabetes mellitus type 2, diverticulosis, hypertension, hyperlipidemia and strokepresented for epigastric pain on 04/08/18, and developed elevated troponin and EKG changes, found to have NSTEMI, and received PCI to the mid LAD. Recommended to have 12 months of uninterrupted DAPT.  She presents today for follow-up of NSTEMI.  She has continued on dual antiplatelet therapy without interruption.  She is concerned about intermittent elevated blood pressures.  She tells me that her blood pressure systolic can be 712 to 458 mmHg, but approximately 2-3 times a week it will be greater than 170 mmHg.  We agree that this is suboptimal control.  Current antihypertensive regimen includes lisinopril 20 mg daily and metoprolol tartrate 25 mg twice daily.     The patient denies chest pain, chest pressure, dyspnea at rest or with exertion, palpitations, PND, orthopnea, or leg swelling. Denies syncope or presyncope. Denies dizziness or lightheadedness.   The patient does not have symptoms concerning for COVID-19 infection (fever, chills, cough, or new shortness of breath).    Past Medical History:  Diagnosis Date   Aortic stenosis    Arthritis    Diabetes mellitus without complication ([place])    Diverticulosis    Hypertension    NSTEMI (non-ST elevated myocardial infarction) ([place])    Stroke [facility])    Past Surgical History:  Procedure Laterality Date   ABDOMINAL SURGERY     CORONARY STENT INTERVENTION N/A 04/08/2018   Procedure: CORONARY STENT INTERVENTION;  Surgeon: Sherren Mocha, MD;  Location: [place] [facility];  Service: Cardiovascular;  Laterality: N/A;   LEFT HEART CATH AND CORONARY ANGIOGRAPHY N/A 04/08/2018   Procedure: LEFT HEART CATH AND CORONARY ANGIOGRAPHY;  Surgeon: Sherren Mocha, MD;  Location: [place] [facility];  Service: Cardiovascular;  Laterality: N/A;     Current Meds  Medication Sig   aspirin 81 MG EC tablet Take 1 tablet (81 mg total) by mouth daily.   carboxymethylcellul-glycerin (OPTIVE) 0.5-0.9 % ophthalmic solution Place 1 drop into both eyes as needed for dry eyes.   glipiZIDE (GLUCOTROL XL) 5 MG 24 hr tablet Take 5 mg by mouth daily with breakfast.   lisinopril (PRINIVIL,ZESTRIL) 20 MG tablet Take 1 tablet (20 mg total) by mouth daily.   metoprolol tartrate (LOPRESSOR) 25 MG tablet Take 1 tablet (25 mg total) by mouth 2 (two) times daily.  nitroGLYCERIN (NITROSTAT) 0.4 MG SL tablet Place 1 tablet (0.4 mg total) under the tongue every 5 (five) minutes as needed for chest pain.   RABEprazole (ACIPHEX) 20 MG tablet Take 1 tablet (20 mg total) by mouth daily.   ticagrelor (BRILINTA) 90 MG TABS tablet Take 1 tablet (90 mg total) by mouth 2 (two) times daily.     Allergies:   Penicillins,  Shrimp [shellfish allergy], and Metformin and related   Social History   Tobacco Use   Smoking status: Never Smoker   Smokeless tobacco: Never Used  Substance Use Topics   Alcohol use: No   Drug use: No     Family Hx: The patient's family history includes Diabetes in her mother.  ROS:   Please see the history of present illness.     All other systems reviewed and are negative.   Prior CV studies:   The following studies were reviewed today:    Labs/Other Tests and Data Reviewed:    EKG:  No ECG reviewed.  Recent Labs: 04/07/2018: TSH 1.943 08/17/2018: ALT 16; BUN 19; Creatinine, Ser 1.11; Hemoglobin 13.7; Platelets 308; Potassium 4.2; Sodium 141   Recent Lipid Panel Lab Results  Component Value Date/Time   CHOL 134 06/11/2018 09:32 AM   TRIG 62 06/11/2018 09:32 AM   HDL 57 06/11/2018 09:32 AM   CHOLHDL 2.4 06/11/2018 09:32 AM   CHOLHDL 3.9 04/07/2018 11:21 PM   LDLCALC 65 06/11/2018 09:32 AM    Wt Readings from Last 3 Encounters:  02/10/19 220 lb (99.8 kg)  08/17/18 220 lb (99.8 kg)  05/14/18 225 lb (102.1 kg)     Objective:    Vital Signs:  BP (!) 192/103    Pulse 85    Ht 5\' 4"  (1.626 m)    Wt 220 lb (99.8 kg)    BMI 37.76 kg/m    VITAL SIGNS:  reviewed GEN:  no acute distress  ASSESSMENT & PLAN:    1. Coronary artery disease involving native coronary artery of native heart without angina pectoris   2. Mixed hyperlipidemia   3. Essential (primary) hypertension   4. Type 2 diabetes mellitus with complication, without long-term current use of insulin (HCC)   5. Aortic valve stenosis, etiology of cardiac valve disease unspecified   6. Obesity (BMI 30-39.9)     Hypertension- we will plan to add amlodipine 5mg  for better blood pressure control.  Continue lisinopril 20 mg, metoprolol 25 mg BID which she is tolerating well.  We will have her follow-up in [facility] our hypertension clinic for up titration of therapy.  CAD status post NSTEMI and PCI- can  likely stop brilinta on 04/09/2019.  We discussed this today, and she demonstrated understanding.  Aspirin should be continued indefinitely.  Mild to moderate aortic valve stenosis- this can likely be rechecked in approximately 2 years by echo, or sooner if she becomes symptomatic.  Hyperlipidemia- reasonable lipid control at this time.  I do not see that she is on a lipid-lowering therapy.  We should reevaluate lipids next year at our follow-up appointment if they are elevated I would strongly recommend reinitiation of statin therapy for secondary prevention of CAD.  COVID-19 Education: The signs and symptoms of COVID-19 were discussed with the patient and how to seek care for testing (follow up with PCP or arrange E-visit).  The importance of social distancing was discussed today.  Time:   Today, I have spent 25 minutes with the patient with telehealth technology discussing the  above problems.     Medication Adjustments/Labs and Tests Ordered: Current medicines are reviewed at length with the patient today.  Concerns regarding medicines are outlined above.   Tests Ordered: No orders of the defined types were placed in this encounter.   Medication Changes: Meds ordered this encounter  Medications   amLODipine (NORVASC) 5 MG tablet    Sig: Take 1 tablet (5 mg total) by mouth daily.    Dispense:  90 tablet    Refill:  3    Follow Up: 5 months  Signed, Parke PoissonGayatri A Nuala Chiles, MD  02/10/2019 9:59 AM    [facility]

## 2019-03-03 ENCOUNTER — Ambulatory Visit (INDEPENDENT_AMBULATORY_CARE_PROVIDER_SITE_OTHER): Payer: Medicare HMO | Admitting: Pharmacist Clinician (PhC)/ Clinical Pharmacy Specialist

## 2019-03-03 ENCOUNTER — Encounter: Payer: Self-pay | Admitting: Pharmacist Clinician (PhC)/ Clinical Pharmacy Specialist

## 2019-03-03 ENCOUNTER — Other Ambulatory Visit: Payer: Self-pay

## 2019-03-03 DIAGNOSIS — I10 Essential (primary) hypertension: Secondary | ICD-10-CM

## 2019-03-03 NOTE — Patient Instructions (Signed)
Return for a a follow up appointment in 4 weeks  Your blood pressure today is 152/88  Check your blood pressure at home daily and keep record of the readings.  Take your BP meds as follows:    Each morning take lisinopril 20 mg and metoprolol 25 mg  Each evening take amlodipine 5 mg and metoprolol 25 mg  Bring all of your meds, your BP cuff and your record of home blood pressures to your next appointment.  Exercise as you're able, try to walk approximately 30 minutes per day.  Keep salt intake to a minimum, especially watch canned and prepared boxed foods.  Eat more fresh fruits and vegetables and fewer canned items.  Avoid eating in fast food restaurants.    HOW TO TAKE YOUR BLOOD PRESSURE: . Rest 5 minutes before taking your blood pressure. .  Don't smoke or drink caffeinated beverages for at least 30 minutes before. . Take your blood pressure before (not after) you eat. . Sit comfortably with your back supported and both feet on the floor (don't cross your legs). . Elevate your arm to heart level on a table or a desk. . Use the proper sized cuff. It should fit smoothly and snugly around your bare upper arm. There should be enough room to slip a fingertip under the cuff. The bottom edge of the cuff should be 1 inch above the crease of the elbow. . Ideally, take 3 measurements at one sitting and record the average.

## 2019-03-03 NOTE — Progress Notes (Signed)
03/03/2019 Vicki Rogers [birth year] [id number]   HPI:  Vicki Rogers is a 73 y.o. female patient of Dr Margaretann Loveless, with a PMH below who presents today for hypertension clinic evaluation.  In addition to hypertension, her medical history is significant for ASCVD (NSEMI with LAD stent 2019),  DM2 (A1c 6.4 03/2018), hyperlipidemia, diverticulosis and prior stroke.    She comes into the office today for BP check and developed bloody nose while waiting in the lobby.   For much of her visit she did not wear a mask, but instead held tissues to her face.  I did ask that she pull the mask up when I had to check her pressure.  She thought she was seeing an MD today and is annoyed as she wants to find out the cause of her ongoing nose bleeds.    Patient reports checking home pressures regularly on both her wrist cuff and watch.  She did not bring the wrist cuff with her, but the watch read 18/50 points off from the office reading.  I suggested to her that she should assume the watch is inaccurate.  She states that her pressure will be good for "awhile" then will go up for 2-3 days before returning back to "normal".  She doesn't have any numbers to share and is vague about what high and normal readings are.   Blood Pressure Goal:  130/80  Current Medications:  Amlodipine 5 mg qam, lisinopril 20 mg, metoprolol 25 mg bid  Family Hx:  Parents unknown - mother died in her 48's  1 sister with diabetes  2 children, no known issues  Social Hx:  No tobacco, no alcohol; hot tea most day, no sodas  Diet:  Home cooked foods, no added salts; veggies are fresh; not much bread or pasta, does eat rice regularly  Exercise:  Walk in neighborhood most days, watch states 1000-1700 steps per day  Home BP readings:  168/39 by watch this morning.  Diastolic off by 50 points in comparison to office reading  Intolerances: no cardiac medication intolerances  Labs: 08/2018:  Na 141, K 4.2, Glu 164, BUN 19, SCr 1.11  GFR 58   Wt Readings from Last 3 Encounters:  03/03/19 225 lb 1.4 oz (102.1 kg)  02/10/19 220 lb (99.8 kg)  08/17/18 220 lb (99.8 kg)   BP Readings from Last 3 Encounters:  03/03/19 (!) 152/88  02/10/19 (!) 192/103  08/18/18 123/78   Pulse Readings from Last 3 Encounters:  03/03/19 67  02/10/19 85  08/18/18 65    Current Outpatient Medications  Medication Sig Dispense Refill  . amLODipine (NORVASC) 5 MG tablet Take 1 tablet (5 mg total) by mouth daily. 90 tablet 3  . aspirin 81 MG EC tablet Take 1 tablet (81 mg total) by mouth daily. 30 tablet 0  . carboxymethylcellul-glycerin (OPTIVE) 0.5-0.9 % ophthalmic solution Place 1 drop into both eyes as needed for dry eyes.    Marland Kitchen glipiZIDE (GLUCOTROL XL) 5 MG 24 hr tablet Take 5 mg by mouth daily with breakfast.    . lisinopril (PRINIVIL,ZESTRIL) 20 MG tablet Take 1 tablet (20 mg total) by mouth daily. 90 tablet 3  . metoprolol tartrate (LOPRESSOR) 25 MG tablet Take 1 tablet (25 mg total) by mouth 2 (two) times daily. 60 tablet 0  . nitroGLYCERIN (NITROSTAT) 0.4 MG SL tablet Place 1 tablet (0.4 mg total) under the tongue every 5 (five) minutes as needed for chest pain. 100 tablet 0  .  RABEprazole (ACIPHEX) 20 MG tablet Take 1 tablet (20 mg total) by mouth daily.    . ticagrelor (BRILINTA) 90 MG TABS tablet Take 1 tablet (90 mg total) by mouth 2 (two) times daily. 60 tablet 0   No current facility-administered medications for this visit.     Allergies  Allergen Reactions  . Penicillins Anaphylaxis    Has patient had a PCN reaction causing immediate rash, facial/tongue/throat swelling, SOB or lightheadedness with hypotension: YES Has patient had a PCN reaction causing severe rash involving mucus membranes or skin necrosis:NO Has patient had a PCN reaction that required hospitalization YES Has patient had a PCN reaction occurring within the last 10 years: NO If all of the above answers are "NO", then may proceed with Cephalosporin use.  . Shrimp  [Shellfish Allergy] Anaphylaxis  . Metformin And Related Nausea And Vomiting    Pt trialed Metformin 500 mg BID 09/2015, 2X hx vomiting; unwilling to retry.    Past Medical History:  Diagnosis Date  . Aortic stenosis   . Arthritis   . Diabetes mellitus without complication (HCC)   . Diverticulosis   . Hypertension   . NSTEMI (non-ST elevated myocardial infarction) (HCC)   . Stroke (HCC)     Blood pressure (!) 152/88, pulse 67, resp. rate 17, height 5\' 5"  (1.651 m), weight 225 lb 1.4 oz (102.1 kg), SpO2 95 %.  Essential (primary) hypertension Patient with essential hypertension, currently not at goal with amlodipine 5 mg, lisinopril 20 mg and metoprolol 25 mg (bid).  Will have her try to be more consistent in time of day to take her medications, I asked her to try for 11-12 am then again around 10 pm.   She will need to move the amlodipine to evening and continue with other medications.  We will see her back in about a month.  She was asked to bring her home BP cuff as well as given instructions on twice daily monitoring.     Phillips HayKristin Alvstad PharmD CPP [facility] [address] [place], [place] [address] [phone]

## 2019-03-03 NOTE — Assessment & Plan Note (Signed)
Patient with essential hypertension, currently not at goal with amlodipine 5 mg, lisinopril 20 mg and metoprolol 25 mg (bid).  Will have her try to be more consistent in time of day to take her medications, I asked her to try for 11-12 am then again around 10 pm.   She will need to move the amlodipine to evening and continue with other medications.  We will see her back in about a month.  She was asked to bring her home BP cuff as well as given instructions on twice daily monitoring.

## 2019-03-24 DIAGNOSIS — E119 Type 2 diabetes mellitus without complications: Secondary | ICD-10-CM | POA: Diagnosis not present

## 2019-03-24 DIAGNOSIS — H40033 Anatomical narrow angle, bilateral: Secondary | ICD-10-CM | POA: Diagnosis not present

## 2019-04-10 ENCOUNTER — Other Ambulatory Visit: Payer: Self-pay

## 2019-04-10 ENCOUNTER — Ambulatory Visit (INDEPENDENT_AMBULATORY_CARE_PROVIDER_SITE_OTHER): Payer: Medicare HMO | Admitting: Pharmacist Clinician (PhC)/ Clinical Pharmacy Specialist

## 2019-04-10 DIAGNOSIS — I10 Essential (primary) hypertension: Secondary | ICD-10-CM | POA: Diagnosis not present

## 2019-04-10 DIAGNOSIS — I213 ST elevation (STEMI) myocardial infarction of unspecified site: Secondary | ICD-10-CM | POA: Diagnosis not present

## 2019-04-10 NOTE — Patient Instructions (Signed)
  Your blood pressure today is 136/84  Check your blood pressure at home daily and keep record of the readings.  Take your BP meds as follows:   Take your metoprolol succ 50 mg once daily with your lisinopril,  Stop Brilinta  Continue with all other medications  Bring all of your meds, your BP cuff and your record of home blood pressures to your next appointment.  Exercise as you're able, try to walk approximately 30 minutes per day.  Keep salt intake to a minimum, especially watch canned and prepared boxed foods.  Eat more fresh fruits and vegetables and fewer canned items.  Avoid eating in fast food restaurants.    HOW TO TAKE YOUR BLOOD PRESSURE: . Rest 5 minutes before taking your blood pressure. .  Don't smoke or drink caffeinated beverages for at least 30 minutes before. . Take your blood pressure before (not after) you eat. . Sit comfortably with your back supported and both feet on the floor (don't cross your legs). . Elevate your arm to heart level on a table or a desk. . Use the proper sized cuff. It should fit smoothly and snugly around your bare upper arm. There should be enough room to slip a fingertip under the cuff. The bottom edge of the cuff should be 1 inch above the crease of the elbow. . Ideally, take 3 measurements at one sitting and record the average.

## 2019-04-10 NOTE — Assessment & Plan Note (Signed)
Patient with hypertension, controlled at home although still runs high in the office.  She believes the increased pressure is due to frustration with waiting for elevators.  Pressure dropped > 20 points after sitting in exam room for 20 minutes.  Home readings look fine, will have patient continue with lisinopril and metoprolol succinate.  Our records show she is on tartrate 25 mg bid (which she takes at noon and 5 pm).  Offered her the 50 mg succinate once daily - she states she already has the 50 mg succinate tablets and takes 1/2 bid.  Advised she take just 1 tab daily, as her divided doses were only about 5 hours apart and she was not willing to take second dose later in the day. Verified dose with pharmacy.  Amlodipine was also removed from her medication list.

## 2019-04-10 NOTE — Progress Notes (Deleted)
HPI:  Vicki Rogers is a 73 y.o. female patient of Dr Jacques Navy, with a PMH below who presents today for hypertension clinic evaluation.  In addition to hypertension, her medical history is significant for ASCVD (NSEMI with LAD stent 2019),  DM2 (A1c 6.4 03/2018), hyperlipidemia, diverticulosis and prior stroke.    She comes into the office today for BP check and developed bloody nose while waiting in the lobby.   For much of her visit she did not wear a mask, but instead held tissues to her face.  I did ask that she pull the mask up when I had to check her pressure.  She thought she was seeing an MD today and is annoyed as she wants to find out the cause of her ongoing nose bleeds.    Patient reports checking home pressures regularly on both her wrist cuff and watch.  She did not bring the wrist cuff with her, but the watch read 18/50 points off from the office reading.  I suggested to her that she should assume the watch is inaccurate.  She states that her pressure will be good for "awhile" then will go up for 2-3 days before returning back to "normal".  She doesn't have any numbers to share and is vague about what high and normal readings are.   Blood Pressure Goal:  130/80  Current Medications:  Amlodipine 5 mg qam, lisinopril 20 mg, metoprolol 25 mg bid  Family Hx:  Parents unknown - mother died in her 51's  1 sister with diabetes  2 children, no known issues  Social Hx:  No tobacco, no alcohol; hot tea most day, no sodas  Diet:  Home cooked foods, no added salts; veggies are fresh; not much bread or pasta, does eat rice regularly  Exercise:  Walk in neighborhood most days, watch states 1000-1700 steps per day  Home BP readings:  168/39 by watch this morning.  Diastolic off by 50 points in comparison to office reading  Intolerances: no cardiac medication intolerances  Labs: 08/2018:  Na 141, K 4.2, Glu 164, BUN 19, SCr 1.11  GFR 58  Wt Readings from Last 3 Encounters:  03/03/19 225 lb  1.4 oz (102.1 kg)  02/10/19 220 lb (99.8 kg)  08/17/18 220 lb (99.8 kg)   BP Readings from Last 3 Encounters:  03/03/19 (!) 152/88  02/10/19 (!) 192/103  08/18/18 123/78   Pulse Readings from Last 3 Encounters:  03/03/19 67  02/10/19 85  08/18/18 65    Current Outpatient Medications  Medication Sig Dispense Refill  . amLODipine (NORVASC) 5 MG tablet Take 1 tablet (5 mg total) by mouth daily. 90 tablet 3  . aspirin 81 MG EC tablet Take 1 tablet (81 mg total) by mouth daily. 30 tablet 0  . carboxymethylcellul-glycerin (OPTIVE) 0.5-0.9 % ophthalmic solution Place 1 drop into both eyes as needed for dry eyes.    Marland Kitchen glipiZIDE (GLUCOTROL XL) 5 MG 24 hr tablet Take 5 mg by mouth daily with breakfast.    . lisinopril (PRINIVIL,ZESTRIL) 20 MG tablet Take 1 tablet (20 mg total) by mouth daily. 90 tablet 3  . metoprolol tartrate (LOPRESSOR) 25 MG tablet Take 1 tablet (25 mg total) by mouth 2 (two) times daily. 60 tablet 0  . nitroGLYCERIN (NITROSTAT) 0.4 MG SL tablet Place 1 tablet (0.4 mg total) under the tongue every 5 (five) minutes as needed for chest pain. 100 tablet 0  . RABEprazole (ACIPHEX) 20 MG tablet Take 1 tablet (  20 mg total) by mouth daily.    . ticagrelor (BRILINTA) 90 MG TABS tablet Take 1 tablet (90 mg total) by mouth 2 (two) times daily. 60 tablet 0   No current facility-administered medications for this visit.     Allergies  Allergen Reactions  . Penicillins Anaphylaxis    Has patient had a PCN reaction causing immediate rash, facial/tongue/throat swelling, SOB or lightheadedness with hypotension: YES Has patient had a PCN reaction causing severe rash involving mucus membranes or skin necrosis:NO Has patient had a PCN reaction that required hospitalization YES Has patient had a PCN reaction occurring within the last 10 years: NO If all of the above answers are "NO", then may proceed with Cephalosporin use.  . Shrimp [Shellfish Allergy] Anaphylaxis  . Metformin And  Related Nausea And Vomiting    Pt trialed Metformin 500 mg BID 09/2015, 2X hx vomiting; unwilling to retry.    Past Medical History:  Diagnosis Date  . Aortic stenosis   . Arthritis   . Diabetes mellitus without complication ([place])   . Diverticulosis   . Hypertension   . NSTEMI (non-ST elevated myocardial infarction) ([place])   . Stroke [facility])     There were no vitals taken for this visit.  No problem-specific Assessment & Plan notes found for this encounter.   Hakan Nudelman Rodriguez-Guzman PharmD, BCPS, [place] [address] [place] [address] 04/10/2019 7:03 AM

## 2019-04-10 NOTE — Progress Notes (Signed)
04/10/2019 Vicki Rogers 1945/10/01 [id number]   HPI:  Vicki Rogers is a 73 y.o. female patient of Dr Margaretann Loveless, with a PMH below who presents today for hypertension clinic evaluation.  In addition to hypertension, her medical history is significant for ASCVD (NSTEMI with LAD stent 2019),  DM2 (A1c 6.4 03/2018), hyperlipidemia, diverticulosis and prior stroke.    Patient returns today for follow up.  At her last visit she was encouraged to take her amlodipine in the evenings with her second dose of metoprolol.  It was also suggested that she take her evening doses later than her current 5 pm time.  She takes her morning medicines "mid-day" - usually around noon.  Today she notes she still takes bid medication at noon and 5 pm.  She also states that she has never taken a dose of amlodipine.  Says she bought the medication after it was prescribed by Dr. Margaretann Loveless, but her home BP was fine and she never felt the need to take it.     Blood Pressure Goal:  130/80  Current Medications:  , lisinopril 20 mg, metoprolol 25 mg bid  Family Hx:  Parents unknown - mother died in her 52's  1 sister with diabetes  2 children, no known issues  Social Hx:  No tobacco, no alcohol; hot tea most day, no sodas  Diet:  Home cooked foods, no added salts; veggies are fresh; not much bread or pasta, does eat rice regularly  Exercise:  Walk in neighborhood most days, watch states 1000-1700 steps per day, up to 3000 steps per day  Home BP readings:  Checked daily at home, 26 readings average 118/81 with range of 103-138/71-92  Intolerances: no cardiac medication intolerances  Labs: 08/2018:  Na 141, K 4.2, Glu 164, BUN 19, SCr 1.11  GFR 58  Wt Readings from Last 3 Encounters:  04/10/19 217 lb (98.4 kg)  03/03/19 225 lb 1.4 oz (102.1 kg)  02/10/19 220 lb (99.8 kg)   BP Readings from Last 3 Encounters:  04/10/19 136/84  03/03/19 (!) 152/88  02/10/19 (!) 192/103   Pulse Readings from Last 3 Encounters:   04/10/19 74  03/03/19 67  02/10/19 85    Current Outpatient Medications  Medication Sig Dispense Refill  . aspirin 81 MG EC tablet Take 1 tablet (81 mg total) by mouth daily. 30 tablet 0  . carboxymethylcellul-glycerin (OPTIVE) 0.5-0.9 % ophthalmic solution Place 1 drop into both eyes as needed for dry eyes.    Marland Kitchen glipiZIDE (GLUCOTROL XL) 5 MG 24 hr tablet Take 5 mg by mouth daily with breakfast.    . lisinopril (PRINIVIL,ZESTRIL) 20 MG tablet Take 1 tablet (20 mg total) by mouth daily. 90 tablet 3  . metoprolol succinate (TOPROL-XL) 50 MG 24 hr tablet Take 50 mg by mouth daily. Take with or immediately following a meal.    . nitroGLYCERIN (NITROSTAT) 0.4 MG SL tablet Place 1 tablet (0.4 mg total) under the tongue every 5 (five) minutes as needed for chest pain. 100 tablet 0  . RABEprazole (ACIPHEX) 20 MG tablet Take 1 tablet (20 mg total) by mouth daily.     No current facility-administered medications for this visit.     Allergies  Allergen Reactions  . Penicillins Anaphylaxis    Has patient had a PCN reaction causing immediate rash, facial/tongue/throat swelling, SOB or lightheadedness with hypotension: YES Has patient had a PCN reaction causing severe rash involving mucus membranes or skin necrosis:NO Has patient had a  PCN reaction that required hospitalization YES Has patient had a PCN reaction occurring within the last 10 years: NO If all of the above answers are "NO", then may proceed with Cephalosporin use.  . Shrimp [Shellfish Allergy] Anaphylaxis  . Metformin And Related Nausea And Vomiting    Pt trialed Metformin 500 mg BID 09/2015, 2X hx vomiting; unwilling to retry.    Past Medical History:  Diagnosis Date  . Aortic stenosis   . Arthritis   . Diabetes mellitus without complication (HCC)   . Diverticulosis   . Hypertension   . NSTEMI (non-ST elevated myocardial infarction) (HCC)   . Stroke (HCC)     Blood pressure 136/84, pulse 74, height 5\' 5"  (1.651 m), weight  217 lb (98.4 kg), SpO2 97 %.    Essential (primary) hypertension Patient with hypertension, controlled at home although still runs high in the office.  She believes the increased pressure is due to frustration with waiting for elevators.  Pressure dropped > 20 points after sitting in exam room for 20 minutes.  Home readings look fine, will have patient continue with lisinopril and metoprolol succinate.  Our records show she is on tartrate 25 mg bid (which she takes at noon and 5 pm).  Offered her the 50 mg succinate once daily - she states she already has the 50 mg succinate tablets and takes 1/2 bid.  Advised she take just 1 tab daily, as her divided doses were only about 5 hours apart and she was not willing to take second dose later in the day. Verified dose with pharmacy.  Amlodipine was also removed from her medication list.   ST elevation myocardial infarction (STEMI) [facility]) Per prior note from Dr. [facility], patient okay to discontinue Brilinta   Jacques Navy PharmD CPP [facility] [address] [place], [place] [phone]

## 2019-04-10 NOTE — Assessment & Plan Note (Signed)
Per prior note from Dr. Margaretann Loveless, patient okay to discontinue Brilinta

## 2019-07-08 ENCOUNTER — Other Ambulatory Visit: Payer: Self-pay | Admitting: Vascular Surgery

## 2019-07-08 DIAGNOSIS — I719 Aortic aneurysm of unspecified site, without rupture: Secondary | ICD-10-CM

## 2019-07-08 DIAGNOSIS — I723 Aneurysm of iliac artery: Secondary | ICD-10-CM

## 2019-07-31 ENCOUNTER — Ambulatory Visit: Payer: Medicare HMO | Admitting: Internal Medicine

## 2019-08-22 ENCOUNTER — Encounter: Payer: Self-pay | Admitting: General Practice

## 2019-09-22 ENCOUNTER — Encounter: Payer: Self-pay | Admitting: Internal Medicine

## 2019-10-13 DIAGNOSIS — Z0110 Encounter for examination of ears and hearing without abnormal findings: Secondary | ICD-10-CM | POA: Diagnosis not present

## 2019-10-13 DIAGNOSIS — I10 Essential (primary) hypertension: Secondary | ICD-10-CM | POA: Diagnosis not present

## 2019-10-13 DIAGNOSIS — Z0100 Encounter for examination of eyes and vision without abnormal findings: Secondary | ICD-10-CM | POA: Diagnosis not present

## 2019-10-13 DIAGNOSIS — Z136 Encounter for screening for cardiovascular disorders: Secondary | ICD-10-CM | POA: Diagnosis not present

## 2019-10-13 DIAGNOSIS — E119 Type 2 diabetes mellitus without complications: Secondary | ICD-10-CM | POA: Diagnosis not present

## 2019-10-13 DIAGNOSIS — Z0000 Encounter for general adult medical examination without abnormal findings: Secondary | ICD-10-CM | POA: Diagnosis not present

## 2019-10-13 DIAGNOSIS — M545 Low back pain: Secondary | ICD-10-CM | POA: Diagnosis not present

## 2019-10-13 DIAGNOSIS — K219 Gastro-esophageal reflux disease without esophagitis: Secondary | ICD-10-CM | POA: Diagnosis not present

## 2019-10-13 DIAGNOSIS — F329 Major depressive disorder, single episode, unspecified: Secondary | ICD-10-CM | POA: Diagnosis not present

## 2020-04-21 DIAGNOSIS — J302 Other seasonal allergic rhinitis: Secondary | ICD-10-CM | POA: Diagnosis not present

## 2020-04-21 DIAGNOSIS — Z23 Encounter for immunization: Secondary | ICD-10-CM | POA: Diagnosis not present

## 2020-04-21 DIAGNOSIS — E785 Hyperlipidemia, unspecified: Secondary | ICD-10-CM | POA: Diagnosis not present

## 2020-04-21 DIAGNOSIS — I10 Essential (primary) hypertension: Secondary | ICD-10-CM | POA: Diagnosis not present

## 2020-04-21 DIAGNOSIS — F329 Major depressive disorder, single episode, unspecified: Secondary | ICD-10-CM | POA: Diagnosis not present

## 2020-04-21 DIAGNOSIS — I2510 Atherosclerotic heart disease of native coronary artery without angina pectoris: Secondary | ICD-10-CM | POA: Diagnosis not present

## 2020-04-21 DIAGNOSIS — E119 Type 2 diabetes mellitus without complications: Secondary | ICD-10-CM | POA: Diagnosis not present

## 2020-04-21 DIAGNOSIS — K219 Gastro-esophageal reflux disease without esophagitis: Secondary | ICD-10-CM | POA: Diagnosis not present

## 2020-04-25 IMAGING — CT CT ANGIO CHEST-ABD-PELV FOR DISSECTION W/ AND WO/W CM
2 of 7 series · 15 of 46 positions shown, 17 images · IV contrast (iopamidol)
Comparison: Radiograph of same day.

CLINICAL DATA: Chest and abdominal pain.

EXAM:
CT ANGIOGRAPHY CHEST, ABDOMEN AND PELVIS
TECHNIQUE: Multidetector CT imaging through the chest, abdomen and pelvis was
performed using the standard protocol during bolus administration of
intravenous contrast. Multiplanar reconstructed images and MIPs were
obtained and reviewed to evaluate the vascular anatomy.
CONTRAST:  100mL GYMNZT-F64 IOPAMIDOL (GYMNZT-F64) INJECTION 76%

[Series 8: dissection 2mm · axial · 0.94mm/px · z∈[-258,+238]mm · 12 of 278 slices shown, 14 images]
[im 15/278  soft-tissue]
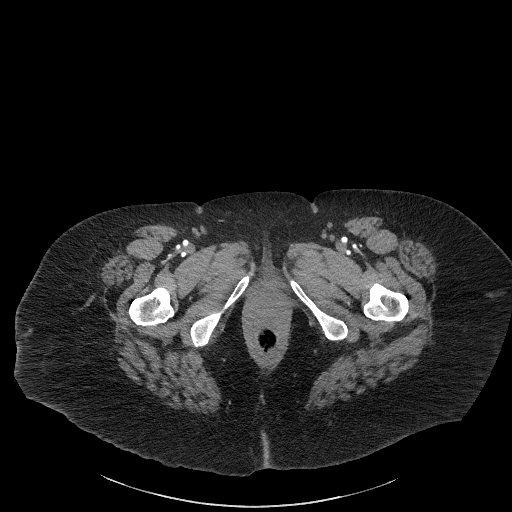
[im 15/278  bone]
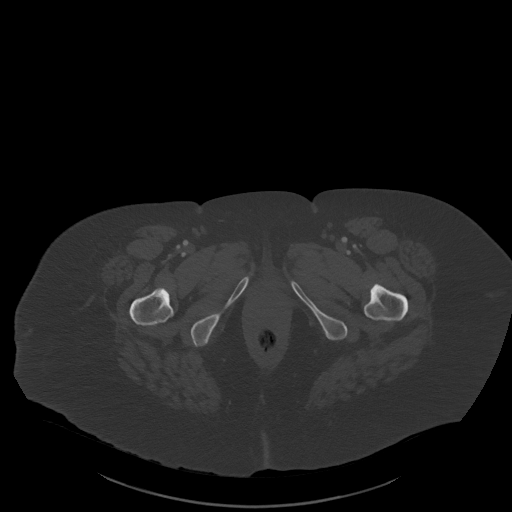
[im 44/278  soft-tissue]
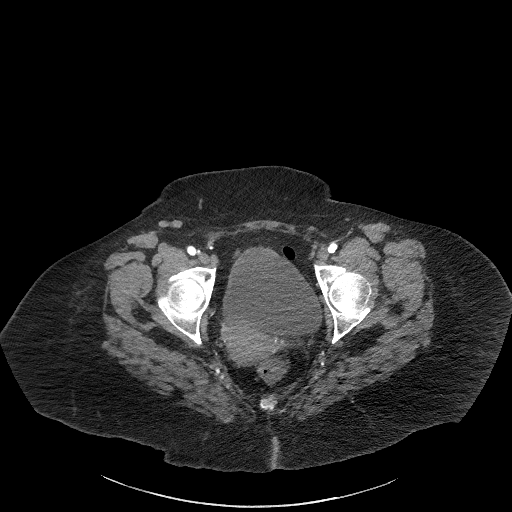
[im 59/278  soft-tissue]
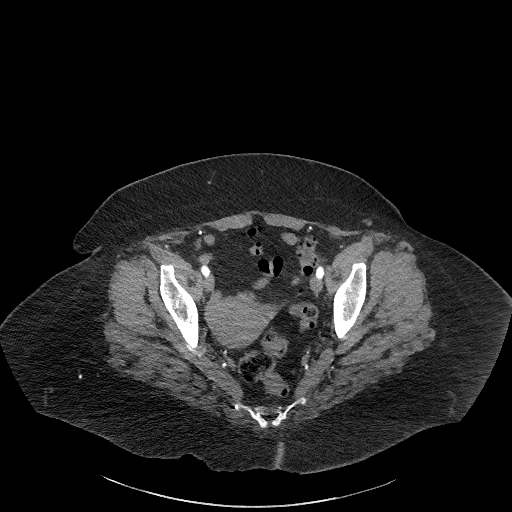
[im 88/278  soft-tissue]
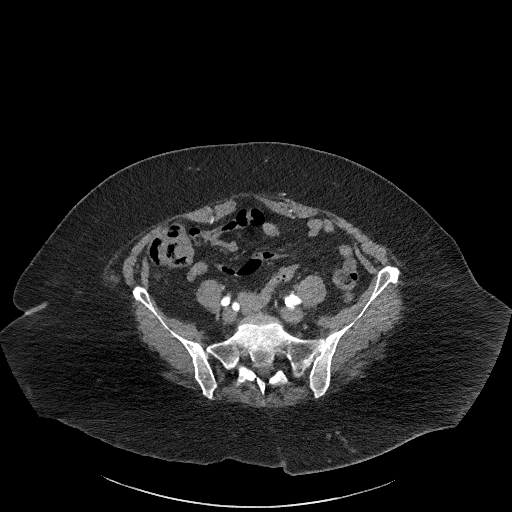
[im 103/278  soft-tissue]
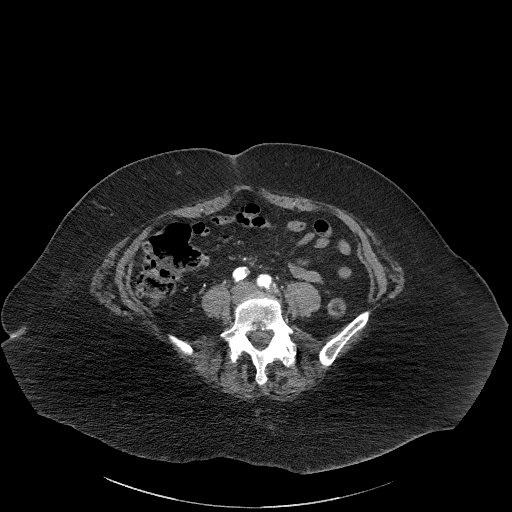
[im 132/278  soft-tissue]
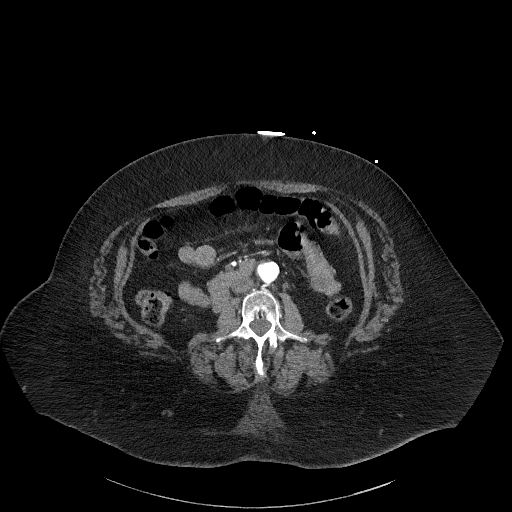
[im 146/278  soft-tissue]
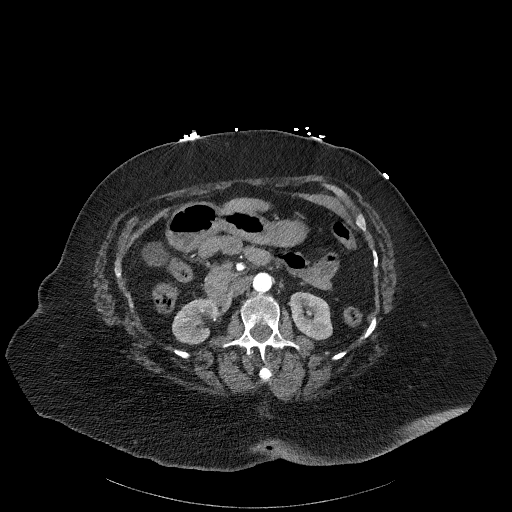
[im 175/278  soft-tissue]
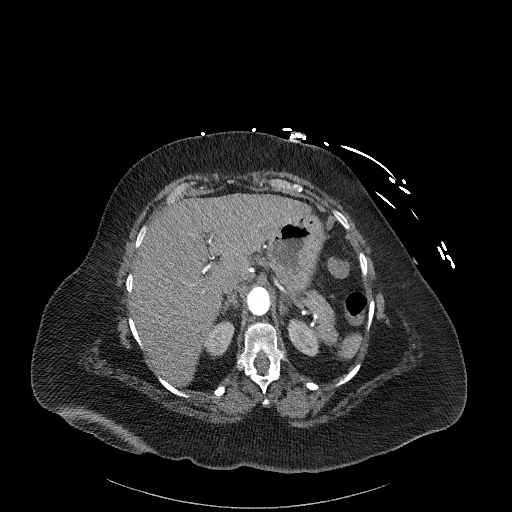
[im 190/278  soft-tissue]
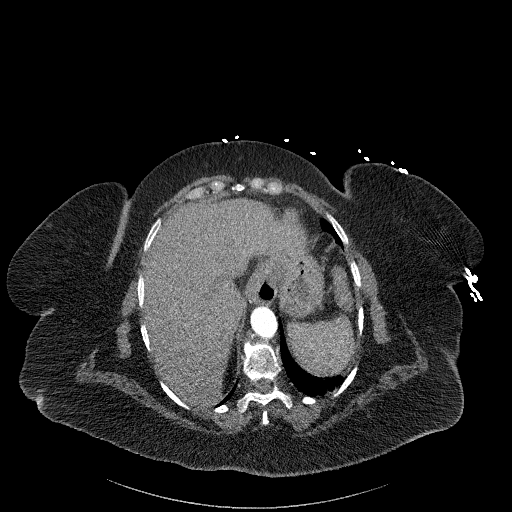
[im 190/278  bone]
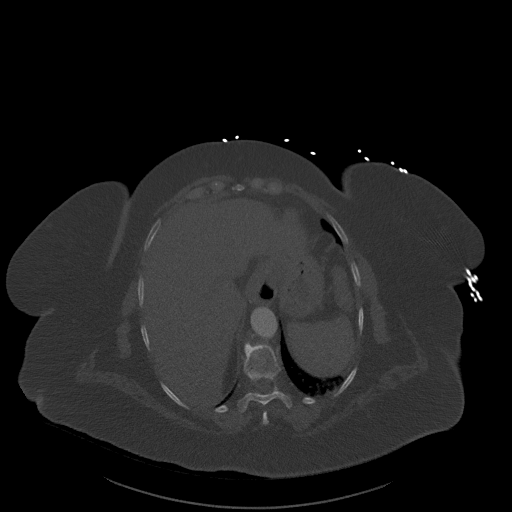
[im 219/278  soft-tissue]
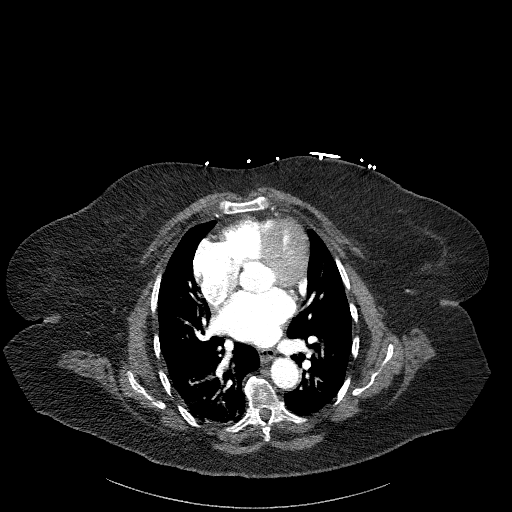
[im 234/278  soft-tissue]
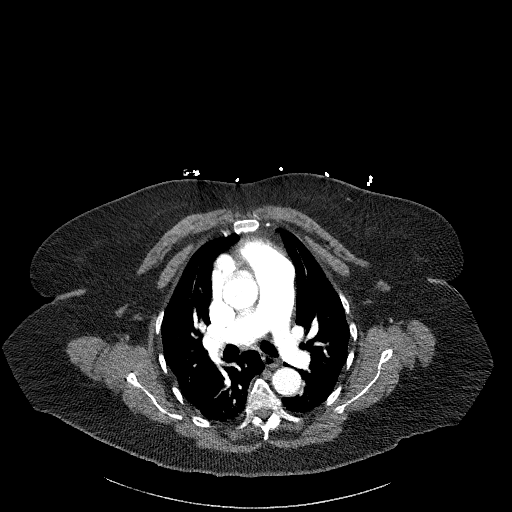
[im 263/278  soft-tissue]
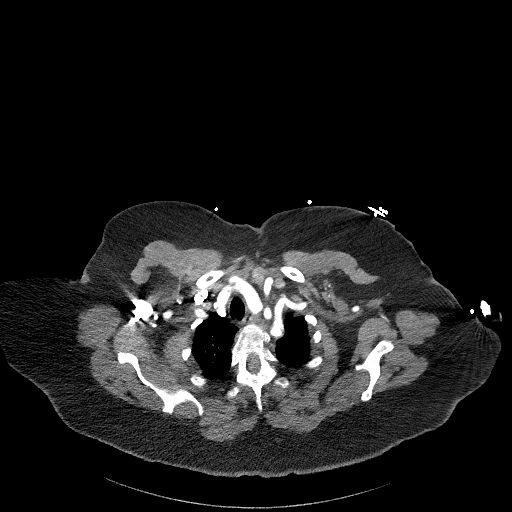

[Series 11: dissection 2mm cor · coronal · 0.91mm/px · 3 of 157 slices shown]
[im 40/157  soft-tissue]
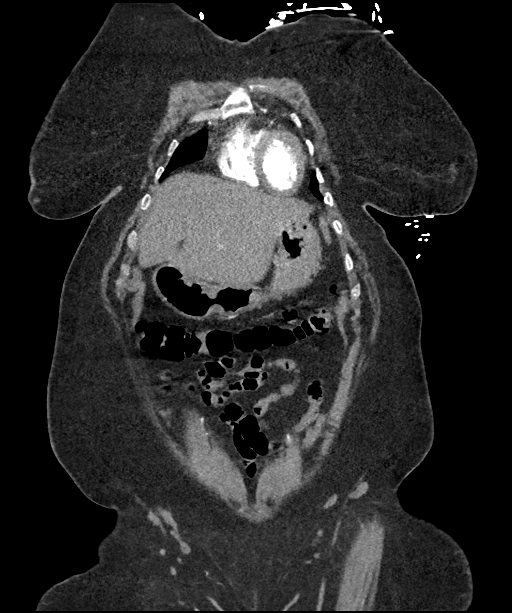
[im 79/157  soft-tissue]
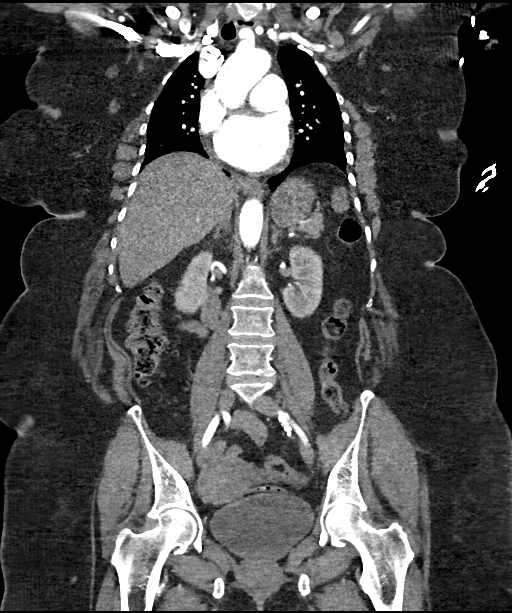
[im 118/157  soft-tissue]
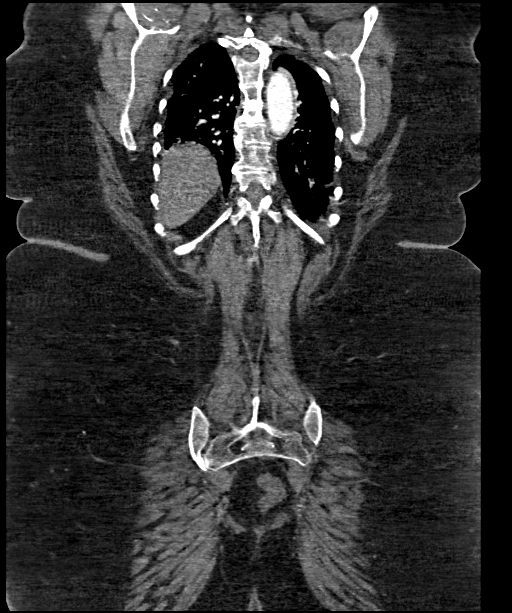

[15 of 46 positions shown; findings below may reference images not displayed]

FINDINGS: CTA CHEST FINDINGS

Cardiovascular: Preferential opacification of the thoracic aorta. No
evidence of thoracic aortic aneurysm or dissection. Normal heart
size. No pericardial effusion.

Mediastinum/Nodes: No enlarged mediastinal, hilar, or axillary lymph
nodes. Thyroid gland, trachea, and esophagus demonstrate no
significant findings.

Lungs/Pleura: Lungs are clear. No pleural effusion or pneumothorax.

Musculoskeletal: Moderate thoracic kyphosis is noted. No acute
abnormality is noted.

Review of the MIP images confirms the above findings.

CTA ABDOMEN AND PELVIS FINDINGS

VASCULAR

Aorta: Atherosclerosis of abdominal aorta is noted without aneurysm
formation. 16 x 14 mm penetrating atherosclerotic ulcer is noted
arising anteriorly from infrarenal abdominal aorta.

Celiac: Patent without evidence of aneurysm, dissection, vasculitis
or significant stenosis.

SMA: Patent without evidence of aneurysm, dissection, vasculitis or
significant stenosis.

Renals: Both renal arteries are patent without evidence of aneurysm,
dissection, vasculitis, fibromuscular dysplasia or significant
stenosis.

IMA: Patent without evidence of aneurysm, dissection, vasculitis or
significant stenosis.

Inflow: 1.9 cm left common iliac artery aneurysm is noted. No
significant stenosis or other abnormality seen involving the iliac
arteries.

Veins: No obvious venous abnormality within the limitations of this
arterial phase study.

Review of the MIP images confirms the above findings.

NON-VASCULAR

Hepatobiliary: No focal liver abnormality is seen. No gallstones,
gallbladder wall thickening, or biliary dilatation.

Pancreas: Unremarkable. No pancreatic ductal dilatation or
surrounding inflammatory changes.

Spleen: Normal in size without focal abnormality.

Adrenals/Urinary Tract: Adrenal glands are unremarkable. Kidneys are
normal, without renal calculi, focal lesion, or hydronephrosis.
Bladder is unremarkable.

Stomach/Bowel: Stomach is within normal limits. Appendix appears
normal. No evidence of bowel wall thickening, distention, or
inflammatory changes.

Lymphatic: No significant adenopathy is noted.

Reproductive: Uterus and bilateral adnexa are unremarkable.

Other: No abdominal wall hernia or abnormality. No abdominopelvic
ascites.

Musculoskeletal: No acute or significant osseous findings.

Review of the MIP images confirms the above findings.
IMPRESSION: No evidence of thoracic or abdominal aortic aneurysm or dissection.

16 x 14 mm penetrating atherosclerotic ulcer is seen arising
anteriorly from infrarenal abdominal aorta.

1.9 cm left common iliac artery aneurysm is noted.

No evidence of significant mesenteric or renal artery stenosis.

Aortic Atherosclerosis (ZGZ3V-BUR.R).

## 2020-05-24 DIAGNOSIS — J302 Other seasonal allergic rhinitis: Secondary | ICD-10-CM | POA: Diagnosis not present

## 2020-05-24 DIAGNOSIS — E119 Type 2 diabetes mellitus without complications: Secondary | ICD-10-CM | POA: Diagnosis not present

## 2020-05-24 DIAGNOSIS — K219 Gastro-esophageal reflux disease without esophagitis: Secondary | ICD-10-CM | POA: Diagnosis not present

## 2020-05-24 DIAGNOSIS — I2510 Atherosclerotic heart disease of native coronary artery without angina pectoris: Secondary | ICD-10-CM | POA: Diagnosis not present

## 2020-05-24 DIAGNOSIS — R42 Dizziness and giddiness: Secondary | ICD-10-CM | POA: Diagnosis not present

## 2020-05-24 DIAGNOSIS — F329 Major depressive disorder, single episode, unspecified: Secondary | ICD-10-CM | POA: Diagnosis not present

## 2020-05-24 DIAGNOSIS — E785 Hyperlipidemia, unspecified: Secondary | ICD-10-CM | POA: Diagnosis not present

## 2020-05-24 DIAGNOSIS — I10 Essential (primary) hypertension: Secondary | ICD-10-CM | POA: Diagnosis not present

## 2020-07-02 DIAGNOSIS — K219 Gastro-esophageal reflux disease without esophagitis: Secondary | ICD-10-CM | POA: Diagnosis not present

## 2020-07-02 DIAGNOSIS — R42 Dizziness and giddiness: Secondary | ICD-10-CM | POA: Diagnosis not present

## 2020-07-02 DIAGNOSIS — I10 Essential (primary) hypertension: Secondary | ICD-10-CM | POA: Diagnosis not present

## 2020-07-02 DIAGNOSIS — F329 Major depressive disorder, single episode, unspecified: Secondary | ICD-10-CM | POA: Diagnosis not present

## 2020-07-02 DIAGNOSIS — E785 Hyperlipidemia, unspecified: Secondary | ICD-10-CM | POA: Diagnosis not present

## 2020-07-02 DIAGNOSIS — J302 Other seasonal allergic rhinitis: Secondary | ICD-10-CM | POA: Diagnosis not present

## 2020-07-02 DIAGNOSIS — I2510 Atherosclerotic heart disease of native coronary artery without angina pectoris: Secondary | ICD-10-CM | POA: Diagnosis not present

## 2020-07-02 DIAGNOSIS — E119 Type 2 diabetes mellitus without complications: Secondary | ICD-10-CM | POA: Diagnosis not present

## 2020-11-23 DIAGNOSIS — Z0100 Encounter for examination of eyes and vision without abnormal findings: Secondary | ICD-10-CM | POA: Diagnosis not present

## 2020-11-23 DIAGNOSIS — Z0000 Encounter for general adult medical examination without abnormal findings: Secondary | ICD-10-CM | POA: Diagnosis not present

## 2020-11-23 DIAGNOSIS — F418 Other specified anxiety disorders: Secondary | ICD-10-CM | POA: Diagnosis not present

## 2020-11-23 DIAGNOSIS — H40033 Anatomical narrow angle, bilateral: Secondary | ICD-10-CM | POA: Diagnosis not present

## 2020-11-23 DIAGNOSIS — M545 Low back pain, unspecified: Secondary | ICD-10-CM | POA: Diagnosis not present

## 2020-11-23 DIAGNOSIS — E785 Hyperlipidemia, unspecified: Secondary | ICD-10-CM | POA: Diagnosis not present

## 2020-11-23 DIAGNOSIS — F329 Major depressive disorder, single episode, unspecified: Secondary | ICD-10-CM | POA: Diagnosis not present

## 2020-11-23 DIAGNOSIS — I10 Essential (primary) hypertension: Secondary | ICD-10-CM | POA: Diagnosis not present

## 2020-11-23 DIAGNOSIS — E782 Mixed hyperlipidemia: Secondary | ICD-10-CM | POA: Diagnosis not present

## 2020-11-23 DIAGNOSIS — K219 Gastro-esophageal reflux disease without esophagitis: Secondary | ICD-10-CM | POA: Diagnosis not present

## 2020-11-23 DIAGNOSIS — E119 Type 2 diabetes mellitus without complications: Secondary | ICD-10-CM | POA: Diagnosis not present

## 2020-11-23 DIAGNOSIS — Z01118 Encounter for examination of ears and hearing with other abnormal findings: Secondary | ICD-10-CM | POA: Diagnosis not present

## 2020-11-23 DIAGNOSIS — G8929 Other chronic pain: Secondary | ICD-10-CM | POA: Diagnosis not present

## 2020-12-23 DIAGNOSIS — E119 Type 2 diabetes mellitus without complications: Secondary | ICD-10-CM | POA: Diagnosis not present

## 2020-12-23 DIAGNOSIS — G8929 Other chronic pain: Secondary | ICD-10-CM | POA: Diagnosis not present

## 2020-12-23 DIAGNOSIS — E782 Mixed hyperlipidemia: Secondary | ICD-10-CM | POA: Diagnosis not present

## 2020-12-23 DIAGNOSIS — F418 Other specified anxiety disorders: Secondary | ICD-10-CM | POA: Diagnosis not present

## 2020-12-23 DIAGNOSIS — K219 Gastro-esophageal reflux disease without esophagitis: Secondary | ICD-10-CM | POA: Diagnosis not present

## 2020-12-23 DIAGNOSIS — I10 Essential (primary) hypertension: Secondary | ICD-10-CM | POA: Diagnosis not present

## 2021-03-22 DIAGNOSIS — E782 Mixed hyperlipidemia: Secondary | ICD-10-CM | POA: Diagnosis not present

## 2021-03-22 DIAGNOSIS — Z6841 Body Mass Index (BMI) 40.0 and over, adult: Secondary | ICD-10-CM | POA: Diagnosis not present

## 2021-05-11 DIAGNOSIS — E785 Hyperlipidemia, unspecified: Secondary | ICD-10-CM | POA: Diagnosis not present

## 2021-05-11 DIAGNOSIS — E119 Type 2 diabetes mellitus without complications: Secondary | ICD-10-CM | POA: Diagnosis not present

## 2021-05-11 DIAGNOSIS — I10 Essential (primary) hypertension: Secondary | ICD-10-CM | POA: Diagnosis not present

## 2021-05-11 DIAGNOSIS — Z23 Encounter for immunization: Secondary | ICD-10-CM | POA: Diagnosis not present

## 2021-05-11 DIAGNOSIS — E782 Mixed hyperlipidemia: Secondary | ICD-10-CM | POA: Diagnosis not present

## 2021-05-11 DIAGNOSIS — K219 Gastro-esophageal reflux disease without esophagitis: Secondary | ICD-10-CM | POA: Diagnosis not present

## 2021-05-25 ENCOUNTER — Other Ambulatory Visit: Payer: Self-pay | Admitting: Physician Assistant

## 2021-05-25 DIAGNOSIS — Z1231 Encounter for screening mammogram for malignant neoplasm of breast: Secondary | ICD-10-CM

## 2021-06-14 DIAGNOSIS — E119 Type 2 diabetes mellitus without complications: Secondary | ICD-10-CM | POA: Diagnosis not present

## 2021-06-14 DIAGNOSIS — I10 Essential (primary) hypertension: Secondary | ICD-10-CM | POA: Diagnosis not present

## 2021-06-14 DIAGNOSIS — N179 Acute kidney failure, unspecified: Secondary | ICD-10-CM | POA: Diagnosis not present

## 2021-06-14 DIAGNOSIS — K219 Gastro-esophageal reflux disease without esophagitis: Secondary | ICD-10-CM | POA: Diagnosis not present

## 2021-06-14 DIAGNOSIS — E782 Mixed hyperlipidemia: Secondary | ICD-10-CM | POA: Diagnosis not present

## 2021-09-21 ENCOUNTER — Telehealth: Payer: Self-pay | Admitting: Internal Medicine

## 2021-09-21 NOTE — Telephone Encounter (Signed)
Spoke with pt, she reports a tingling type pain in her chest that comes and goes. It can occur with exertion or rest and last just a few seconds. She reports extreme fatigue and lack of energy with any exertion. She reports her biggest issues are vertigo and low back pain. She reports this is nothing like how she felt prior to her stent. She does not want to see the PA or NP. She has an appointment with dr Jacques Navy in may and will keep that appointment. Patient voiced understanding to call if her symptoms change or worsen prior to that appointment. Patient was encouraged to follow up with her medical doctor concerning her other generalized complaints. ?

## 2021-09-21 NOTE — Telephone Encounter (Signed)
Pt c/o of Chest Pain: STAT if CP now or developed within 24 hours ? ?1. Are you having CP right now? no ? ?2. Are you experiencing any other symptoms (ex. SOB, nausea, vomiting, sweating)? Vertigo, tired, lower back pain ? ?3. How long have you been experiencing CP? 4-5 months ? ?4. Is your CP continuous or coming and going? Comes and goes ? ?5. Have you taken Nitroglycerin? no ??  ? ?Patient states she has been having chest pain for 4-5 months and says it comes and goes. She says she also has been having vertigo.  ?

## 2021-10-19 DIAGNOSIS — Z136 Encounter for screening for cardiovascular disorders: Secondary | ICD-10-CM | POA: Diagnosis not present

## 2021-10-19 DIAGNOSIS — J302 Other seasonal allergic rhinitis: Secondary | ICD-10-CM | POA: Diagnosis not present

## 2021-10-19 DIAGNOSIS — Z0001 Encounter for general adult medical examination with abnormal findings: Secondary | ICD-10-CM | POA: Diagnosis not present

## 2021-10-19 DIAGNOSIS — I10 Essential (primary) hypertension: Secondary | ICD-10-CM | POA: Diagnosis not present

## 2021-10-19 DIAGNOSIS — E782 Mixed hyperlipidemia: Secondary | ICD-10-CM | POA: Diagnosis not present

## 2021-10-19 DIAGNOSIS — E119 Type 2 diabetes mellitus without complications: Secondary | ICD-10-CM | POA: Diagnosis not present

## 2021-11-22 DIAGNOSIS — H524 Presbyopia: Secondary | ICD-10-CM | POA: Diagnosis not present

## 2021-11-22 DIAGNOSIS — H5213 Myopia, bilateral: Secondary | ICD-10-CM | POA: Diagnosis not present

## 2021-11-24 ENCOUNTER — Encounter: Payer: Self-pay | Admitting: Internal Medicine

## 2021-11-24 ENCOUNTER — Ambulatory Visit: Payer: Medicare HMO | Admitting: Internal Medicine

## 2021-11-24 VITALS — BP 160/120 | HR 67 | Ht 64.0 in | Wt 226.6 lb

## 2021-11-24 DIAGNOSIS — I10 Essential (primary) hypertension: Secondary | ICD-10-CM

## 2021-11-24 DIAGNOSIS — R0683 Snoring: Secondary | ICD-10-CM

## 2021-11-24 DIAGNOSIS — I350 Nonrheumatic aortic (valve) stenosis: Secondary | ICD-10-CM | POA: Diagnosis not present

## 2021-11-24 DIAGNOSIS — E782 Mixed hyperlipidemia: Secondary | ICD-10-CM

## 2021-11-24 DIAGNOSIS — I2510 Atherosclerotic heart disease of native coronary artery without angina pectoris: Secondary | ICD-10-CM

## 2021-11-24 DIAGNOSIS — I214 Non-ST elevation (NSTEMI) myocardial infarction: Secondary | ICD-10-CM | POA: Diagnosis not present

## 2021-11-24 DIAGNOSIS — R011 Cardiac murmur, unspecified: Secondary | ICD-10-CM

## 2021-11-24 NOTE — Patient Instructions (Signed)
Medication Instructions:  START: ATORVASTATIN  *If you need a refill on your cardiac medications before your next appointment, please call your pharmacy*  Testing/Procedures: Your physician has requested that you have an echocardiogram TOMORROW MAY 19th at 11AM. Echocardiography is a painless test that uses sound waves to create images of your heart. It provides your doctor with information about the size and shape of your heart and how well your heart's chambers and valves are working. You may receive an ultrasound enhancing agent through an IV if needed to better visualize your heart during the echo.This procedure takes approximately one hour. There are no restrictions for this procedure. This will take place at [facility] SLEEP TEST- SOMEONE WILL CONTACT YOU TO GET THIS SCHEDULED   Follow-Up: At [facility], you and your health needs are our priority.  As part of our continuing mission to provide you with exceptional heart care, we have created designated Provider Care Teams.  These Care Teams include your primary Cardiologist (physician) and Advanced Practice Providers (APPs -  Physician Assistants and Nurse Practitioners) who all work together to provide you with the care you need, when you need it.  We recommend signing up for the patient portal called "MyChart".  Sign up information is provided on this After Visit Summary.  MyChart is used to connect with patients for Virtual Visits (Telemedicine).  Patients are able to view lab/test results, encounter notes, upcoming appointments, etc.  Non-urgent messages can be sent to your provider as well.   To learn more about what you can do with MyChart, go to [email or web address].    Your next appointment:   MAY 24th at 2:40PM  The format for your next appointment:   In Person  Provider:   Parke Poisson, MD

## 2021-11-24 NOTE — Progress Notes (Signed)
Cardiology Office Note:    Date:  11/24/2021   ID:  Hewitt Blade, DOB Sep 16, 1945, MRN [id number]  PCP:  Benito Mccreedy, MD  Cardiologist:  Elouise Munroe, MD  Electrophysiologist:  None   Referring MD: Benito Mccreedy, MD   Chief Complaint/Reason for Referral: NSTEMI, HTN, cardiovascular review  History of Present Illness:    Vicki Rogers is a 76 y.o. female with a history of diabetes mellitus type 2, diverticulosis, hypertension, hyperlipidemia and stroke presented for epigastric pain on 04/08/18, and developed elevated troponin and EKG changes, found to have NSTEMI, and received PCI to the mid LAD. Recommended to have 12 months of uninterrupted DAPT. She presents today for follow-up of NSTEMI prior to starting atorvastatin recommended by her PMD, and before having cataract surgery.  The patient denies chest pain, chest pressure, dyspnea at rest or with exertion, palpitations, PND, orthopnea, or leg swelling. Denies cough, fever, chills. Denies nausea, vomiting. Denies syncope or presyncope. Denies dizziness or lightheadedness.   Endorses severe dry mouth. Denies apneic episodes. We discussed that she may be snoring at night and I would recommend screening for sleep apnea which may assist with dry mouth. She describes her dry mouth as only when waking up in the morning, and that her tongue is desiccated to the point that when she drinks it "sizzles". Strongly recommend screening.   LDL very high, recently started on atorvastatin by PMD. She has not started and wanted to review with me. She was intended to be on statin therapy for secondary prev of CAD but is no longer on atorvastatin 80 mg daily which was prescribed in hospital in 2019 at time of NSTEMI. It is not clear why.   Known mild-moderate AS with mean gradient 14 mmHg in 2019. This seems to have progressed on exam. No CP, DOE or syncope.   She would like to have cataract surgery but is hesitant. We discussed that it is  reasonable to proceed with this low risk surgery, but with loud murmur on exam, perhap it can at least wait until after her echo, scheduled tomorrow.   Past Medical History:  Diagnosis Date   Aortic stenosis    Arthritis    Diabetes mellitus without complication ([place])    Diverticulosis    Hypertension    NSTEMI (non-ST elevated myocardial infarction) ([place])    Stroke [facility])     Past Surgical History:  Procedure Laterality Date   ABDOMINAL SURGERY     CORONARY STENT INTERVENTION N/A 04/08/2018   Procedure: CORONARY STENT INTERVENTION;  Surgeon: Sherren Mocha, MD;  Location: [place] [facility];  Service: Cardiovascular;  Laterality: N/A;   LEFT HEART CATH AND CORONARY ANGIOGRAPHY N/A 04/08/2018   Procedure: LEFT HEART CATH AND CORONARY ANGIOGRAPHY;  Surgeon: Sherren Mocha, MD;  Location: [place] [facility];  Service: Cardiovascular;  Laterality: N/A;    Current Medications: Current Meds  Medication Sig   aspirin 81 MG EC tablet Take 1 tablet (81 mg total) by mouth daily.   carboxymethylcellul-glycerin (REFRESH OPTIVE) 0.5-0.9 % ophthalmic solution Place 1 drop into both eyes as needed for dry eyes.   glipiZIDE (GLUCOTROL XL) 5 MG 24 hr tablet Take 5 mg by mouth daily with breakfast.   losartan (COZAAR) 50 MG tablet Take by mouth.   metoprolol succinate (TOPROL-XL) 50 MG 24 hr tablet Take 50 mg by mouth daily. Take with or immediately following a meal.     Allergies:   Penicillins, Shrimp [shellfish allergy], and Metformin and related  Social History   Tobacco Use   Smoking status: Never   Smokeless tobacco: Never  Vaping Use   Vaping Use: Never used  Substance Use Topics   Alcohol use: No   Drug use: No     Family History: The patient's family history includes Diabetes in her mother.  ROS:   Please see the history of present illness.    All other systems reviewed and are negative.  EKGs/Labs/Other Studies Reviewed:    The following studies were reviewed  today:  EKG:  NSR  Imaging studies that I have independently reviewed today: n/a  Recent Labs: No results found for requested labs within last 8760 hours.  Recent Lipid Panel    Component Value Date/Time   CHOL 134 06/11/2018 0932   TRIG 62 06/11/2018 0932   HDL 57 06/11/2018 0932   CHOLHDL 2.4 06/11/2018 0932   CHOLHDL 3.9 04/07/2018 2321   VLDL 11 04/07/2018 2321   LDLCALC 65 06/11/2018 0932    Physical Exam:    VS:  BP (!) 160/120   Pulse 67   Ht 5\' 4"  (1.626 m)   Wt 226 lb 9.6 oz (102.8 kg)   SpO2 97%   BMI 38.90 kg/m     Wt Readings from Last 5 Encounters:  11/24/21 226 lb 9.6 oz (102.8 kg)  04/10/19 217 lb (98.4 kg)  03/03/19 225 lb 1.4 oz (102.1 kg)  02/10/19 220 lb (99.8 kg)  08/17/18 220 lb (99.8 kg)    Constitutional: No acute distress Eyes: sclera non-icteric, normal conjunctiva and lids ENMT: normal dentition, moist mucous membranes Cardiovascular: regular rhythm, normal rate, 3/6 SEM, obscures S2, across precordium. No jugular venous distention.  Respiratory: clear to auscultation bilaterally GI : normal bowel sounds, soft and nontender. No distention.   MSK: extremities warm, well perfused. No edema.  NEURO: grossly nonfocal exam, moves all extremities. PSYCH: alert and oriented x 3, normal mood and affect.   ASSESSMENT:    1. Systolic murmur   2. Nonrheumatic aortic valve stenosis   3. Non-ST elevation (NSTEMI) myocardial infarction ([place])   4. Coronary artery disease involving native coronary artery of native heart without angina pectoris   5. Essential (primary) hypertension   6. Mixed hyperlipidemia   7. Snoring    PLAN:    Systolic murmur - Plan: EKG 12-Lead, ECHOCARDIOGRAM COMPLETE Nonrheumatic aortic valve stenosis -Progression of murmur with known mild aortic valve stenosis on echo in 2019.  Murmur is suggestive of severe AS.  Will arrange echocardiogram.  The patient is not endorsing chest pain today she had called in in March for  symptoms of intermittent chest discomfort, see telephone notes from 09/21/2021.  She had noted at that time chest pain has been going on for 4 to 5 months.  She had also endorsed extreme fatigue and lack of energy.  We are screening for sleep apnea as noted below.    Non-ST elevation (NSTEMI) myocardial infarction [facility]) Coronary artery disease involving native coronary artery of native heart without angina pectoris -She had reported chest pain in March but does not endorse chest pain today for me.  She states that she is overall okay.  She continues on aspirin and metoprolol for secondary prevention of CAD.  She did have PCI and completed 12 months of DAPT.  Essential (primary) hypertension-continues on losartan, metoprolol succinate 50 mg daily.  She did not take her medication prior to her visit today but tells me her blood pressure is generally in the 120s  over 55s.  I have asked her to take her medications prior to her next follow-up so we can accurately assess.  Will not titrate therapy today in the event that she does have severe aortic valve stenosis.  Mixed hyperlipidemia-her most recent lipid panel from April shows an LDL of 198, triglycerides 73, HDL 70.  We discussed that her LDL is entirely too high and she should resume atorvastatin as recommended by her primary care physician, I agree.  She did not recall the dose of her atorvastatin but on record review it appears atorvastatin was 80 mg daily and recently refilled.  I have recommended that she resume this today and monitor for side effects.  She would qualify for PCSK9 inhibitor given her history of NSTEMI with PCI and we should consider this if she has statin myalgias.  Snoring - Plan: Home sleep test -Possible snoring however more prominent symptom is extreme dry mouth.  We will screen with home sleep test given patient's hesitance to complete a in lab sleep study.  Total time of encounter: 40 minutes total time of encounter, including  30 minutes spent in face-to-face patient care on the date of this encounter. This time includes coordination of care and counseling regarding above mentioned problem list. Remainder of non-face-to-face time involved reviewing chart documents/testing relevant to the patient encounter and documentation in the medical record. I have independently reviewed documentation from referring provider.   Cherlynn Kaiser, MD, [place]   Shared Decision Making/Informed Consent:       Medication Adjustments/Labs and Tests Ordered: Current medicines are reviewed at length with the patient today.  Concerns regarding medicines are outlined above.   Orders Placed This Encounter  Procedures   EKG 12-Lead   ECHOCARDIOGRAM COMPLETE   Home sleep test    No orders of the defined types were placed in this encounter.   Patient Instructions  Medication Instructions:  START: ATORVASTATIN  *If you need a refill on your cardiac medications before your next appointment, please call your pharmacy*  Testing/Procedures: Your physician has requested that you have an echocardiogram TOMORROW MAY 19th at [place]. Echocardiography is a painless test that uses sound waves to create images of your heart. It provides your doctor with information about the size and shape of your heart and how well your heart's chambers and valves are working. You may receive an ultrasound enhancing agent through an IV if needed to better visualize your heart during the echo.This procedure takes approximately one hour. There are no restrictions for this procedure. This will take place at [place] TO GET THIS SCHEDULED   Follow-Up: At [facility], you and your health needs are our priority.  As part of our continuing mission to provide you with exceptional heart care, we have created designated Provider Care Teams.  These Care Teams include your primary Cardiologist  (physician) and Advanced Practice Providers (APPs -  Physician Assistants and Nurse Practitioners) who all work together to provide you with the care you need, when you need it.  We recommend signing up for the patient portal called "MyChart".  Sign up information is provided on this After Visit Summary.  MyChart is used to connect with patients for Virtual Visits (Telemedicine).  Patients are able to view lab/test results, encounter notes, upcoming appointments, etc.  Non-urgent messages can be sent to your provider as well.   To learn more about what you can do  with MyChart, go to [email or web address].    Your next appointment:   MAY 24th at 2:40PM  The format for your next appointment:   In Person  Provider:   Elouise Munroe, MD

## 2021-11-25 ENCOUNTER — Ambulatory Visit (HOSPITAL_COMMUNITY)
Admission: RE | Admit: 2021-11-25 | Discharge: 2021-11-25 | Disposition: A | Discharge status: Home / Self Care | Payer: Medicare HMO | Source: Ambulatory Visit | Attending: Internal Medicine | Admitting: Internal Medicine

## 2021-11-25 DIAGNOSIS — I350 Nonrheumatic aortic (valve) stenosis: Secondary | ICD-10-CM | POA: Diagnosis not present

## 2021-11-25 DIAGNOSIS — E119 Type 2 diabetes mellitus without complications: Secondary | ICD-10-CM | POA: Diagnosis not present

## 2021-11-25 DIAGNOSIS — I252 Old myocardial infarction: Secondary | ICD-10-CM | POA: Insufficient documentation

## 2021-11-25 DIAGNOSIS — I10 Essential (primary) hypertension: Secondary | ICD-10-CM | POA: Diagnosis not present

## 2021-11-25 DIAGNOSIS — R011 Cardiac murmur, unspecified: Secondary | ICD-10-CM | POA: Insufficient documentation

## 2021-11-25 LAB — ECHOCARDIOGRAM COMPLETE
AR max vel: 0.92 cm2
AV Area VTI: 0.93 cm2
AV Area mean vel: 0.9 cm2
AV Mean grad: 26.7 mmHg
AV Peak grad: 45.3 mmHg
Ao pk vel: 3.37 m/s
Area-P 1/2: 2.36 cm2
Calc EF: 55.1 %
P 1/2 time: 597 msec
S' Lateral: 1.8 cm
Single Plane A2C EF: 57.7 %
Single Plane A4C EF: 54.4 %

## 2021-11-25 NOTE — Progress Notes (Signed)
  Echocardiogram 2D Echocardiogram has been performed.  Augustine Radar 11/25/2021, 11:51 AM

## 2021-11-30 ENCOUNTER — Ambulatory Visit: Payer: Medicare HMO | Admitting: Internal Medicine

## 2021-11-30 ENCOUNTER — Encounter: Payer: Self-pay | Admitting: Internal Medicine

## 2021-11-30 VITALS — BP 130/80 | HR 69 | Ht 64.0 in | Wt 226.8 lb

## 2021-11-30 DIAGNOSIS — E782 Mixed hyperlipidemia: Secondary | ICD-10-CM | POA: Diagnosis not present

## 2021-11-30 DIAGNOSIS — I2510 Atherosclerotic heart disease of native coronary artery without angina pectoris: Secondary | ICD-10-CM

## 2021-11-30 DIAGNOSIS — I350 Nonrheumatic aortic (valve) stenosis: Secondary | ICD-10-CM | POA: Diagnosis not present

## 2021-11-30 DIAGNOSIS — R011 Cardiac murmur, unspecified: Secondary | ICD-10-CM | POA: Diagnosis not present

## 2021-11-30 DIAGNOSIS — R0683 Snoring: Secondary | ICD-10-CM

## 2021-11-30 DIAGNOSIS — I214 Non-ST elevation (NSTEMI) myocardial infarction: Secondary | ICD-10-CM | POA: Diagnosis not present

## 2021-11-30 DIAGNOSIS — I10 Essential (primary) hypertension: Secondary | ICD-10-CM | POA: Diagnosis not present

## 2021-11-30 NOTE — Patient Instructions (Addendum)
Medication Instructions:  No Changes In Medications at this time.  *If you need a refill on your cardiac medications before your next appointment, please call your pharmacy*  Testing/Procedures: Your physician has requested that you have an echocardiogram IN 6 MONTHS. Echocardiography is a painless test that uses sound waves to create images of your heart. It provides your doctor with information about the size and shape of your heart and how well your heart's chambers and valves are working. You may receive an ultrasound enhancing agent through an IV if needed to better visualize your heart during the echo.This procedure takes approximately one hour. There are no restrictions for this procedure. This will take place at the [address].   Follow-Up: At [facility], you and your health needs are our priority.  As part of our continuing mission to provide you with exceptional heart care, we have created designated Provider Care Teams.  These Care Teams include your primary Cardiologist (physician) and Advanced Practice Providers (APPs -  Physician Assistants and Nurse Practitioners) who all work together to provide you with the care you need, when you need it.  We recommend signing up for the patient portal called "MyChart".  Sign up information is provided on this After Visit Summary.  MyChart is used to connect with patients for Virtual Visits (Telemedicine).  Patients are able to view lab/test results, encounter notes, upcoming appointments, etc.  Non-urgent messages can be sent to your provider as well.   To learn more about what you can do with MyChart, go to [email or web address].    Your next appointment:   6 month(s) RIGHT AFTER ECHO  The format for your next appointment:   In Person  Provider:   Parke Poisson, MD

## 2021-11-30 NOTE — Progress Notes (Unsigned)
Cardiology Office Note:    Date:  11/30/2021   ID:  Vicki Rogers, DOB 09/30/1945, MRN [id number]  PCP:  Benito Mccreedy, MD  Cardiologist:  Elouise Munroe, MD  Electrophysiologist:  None   Referring MD: Benito Mccreedy, MD   Chief Complaint/Reason for Referral: NSTEMI, HTN, cardiovascular review  History of Present Illness:    Vicki Rogers is a 76 y.o. female with a history of diabetes mellitus type 2, diverticulosis, hypertension, hyperlipidemia and stroke presented for epigastric pain on 04/08/18, and developed elevated troponin and EKG changes, found to have NSTEMI, and received PCI to the mid LAD. Recommended to have 12 months of uninterrupted DAPT. She presents today for follow-up of NSTEMI prior to starting atorvastatin recommended by her PMD, and before having cataract surgery.  The patient denies chest pain, chest pressure, dyspnea at rest or with exertion, palpitations, PND, orthopnea, or leg swelling. Denies cough, fever, chills. Denies nausea, vomiting. Denies syncope or presyncope. Denies dizziness or lightheadedness.   Endorses severe dry mouth. Denies apneic episodes. We discussed that she may be snoring at night and I would recommend screening for sleep apnea which may assist with dry mouth. She describes her dry mouth as only when waking up in the morning, and that her tongue is desiccated to the point that when she drinks it "sizzles". Strongly recommend screening.   LDL very high, recently started on atorvastatin by PMD. She has not started and wanted to review with me. She was intended to be on statin therapy for secondary prev of CAD but is no longer on atorvastatin 80 mg daily which was prescribed in hospital in 2019 at time of NSTEMI. It is not clear why.   Known mild-moderate AS with mean gradient 14 mmHg in 2019. This seems to have progressed on exam. No CP, DOE or syncope.   She would like to have cataract surgery but is hesitant. We discussed that it is  reasonable to proceed with this low risk surgery, but with loud murmur on exam, perhap it can at least wait until after her echo, scheduled tomorrow.   Past Medical History:  Diagnosis Date   Aortic stenosis    Arthritis    Diabetes mellitus without complication ([place])    Diverticulosis    Hypertension    NSTEMI (non-ST elevated myocardial infarction) ([place])    Stroke [facility])     Past Surgical History:  Procedure Laterality Date   ABDOMINAL SURGERY     CORONARY STENT INTERVENTION N/A 04/08/2018   Procedure: CORONARY STENT INTERVENTION;  Surgeon: Sherren Mocha, MD;  Location: [place] [facility];  Service: Cardiovascular;  Laterality: N/A;   LEFT HEART CATH AND CORONARY ANGIOGRAPHY N/A 04/08/2018   Procedure: LEFT HEART CATH AND CORONARY ANGIOGRAPHY;  Surgeon: Sherren Mocha, MD;  Location: [place] [facility];  Service: Cardiovascular;  Laterality: N/A;    Current Medications: Current Meds  Medication Sig   aspirin 81 MG EC tablet Take 1 tablet (81 mg total) by mouth daily.   atorvastatin (LIPITOR) 80 MG tablet Take 80 mg by mouth daily. Take 1 Tablet Daily   carboxymethylcellul-glycerin (REFRESH OPTIVE) 0.5-0.9 % ophthalmic solution Place 1 drop into both eyes as needed for dry eyes.   glipiZIDE (GLUCOTROL XL) 5 MG 24 hr tablet Take 5 mg by mouth daily with breakfast.   losartan (COZAAR) 50 MG tablet Take by mouth.   metoprolol succinate (TOPROL-XL) 50 MG 24 hr tablet Take 50 mg by mouth daily. Take with or immediately following a  meal.     Allergies:   Penicillins, Shrimp [shellfish allergy], and Metformin and related   Social History   Tobacco Use   Smoking status: Never   Smokeless tobacco: Never  Vaping Use   Vaping Use: Never used  Substance Use Topics   Alcohol use: No   Drug use: No     Family History: The patient's family history includes Diabetes in her mother.  ROS:   Please see the history of present illness.    All other systems reviewed and are  negative.  EKGs/Labs/Other Studies Reviewed:    The following studies were reviewed today:  EKG:  not performed today. NSR last visit.  Imaging studies that I have independently reviewed today: n/a  Recent Labs: No results found for requested labs within last 8760 hours.  Recent Lipid Panel    Component Value Date/Time   CHOL 134 06/11/2018 0932   TRIG 62 06/11/2018 0932   HDL 57 06/11/2018 0932   CHOLHDL 2.4 06/11/2018 0932   CHOLHDL 3.9 04/07/2018 2321   VLDL 11 04/07/2018 2321   LDLCALC 65 06/11/2018 0932    Physical Exam:    VS:  BP 130/80   Pulse 69   Ht 5\' 4"  (1.626 m)   Wt 226 lb 12.8 oz (102.9 kg)   SpO2 98%   BMI 38.93 kg/m     Wt Readings from Last 5 Encounters:  11/30/21 226 lb 12.8 oz (102.9 kg)  11/24/21 226 lb 9.6 oz (102.8 kg)  04/10/19 217 lb (98.4 kg)  03/03/19 225 lb 1.4 oz (102.1 kg)  02/10/19 220 lb (99.8 kg)    Constitutional: No acute distress Eyes: sclera non-icteric, normal conjunctiva and lids ENMT: normal dentition, moist mucous membranes Cardiovascular: regular rhythm, normal rate, 3/6 SEM, obscures S2, across precordium. No jugular venous distention.  Respiratory: clear to auscultation bilaterally GI : normal bowel sounds, soft and nontender. No distention.   MSK: extremities warm, well perfused. No edema.  NEURO: grossly nonfocal exam, moves all extremities. PSYCH: alert and oriented x 3, normal mood and affect.   ASSESSMENT:    No diagnosis found.  PLAN:    Systolic murmur - Plan: EKG 12-Lead, ECHOCARDIOGRAM COMPLETE Nonrheumatic aortic valve stenosis -Progression of murmur with known mild aortic valve stenosis on echo in 2019.  Murmur is suggestive of severe AS.  Will arrange echocardiogram.  The patient is not endorsing chest pain today she had called in in March for symptoms of intermittent chest discomfort, see telephone notes from 09/21/2021.  She had noted at that time chest pain has been going on for 4 to 5 months.  She  had also endorsed extreme fatigue and lack of energy.  We are screening for sleep apnea as noted below.    Non-ST elevation (NSTEMI) myocardial infarction [facility]) Coronary artery disease involving native coronary artery of native heart without angina pectoris -She had reported chest pain in March but does not endorse chest pain today for me.  She states that she is overall okay.  She continues on aspirin and metoprolol for secondary prevention of CAD.  She did have PCI and completed 12 months of DAPT.  Essential (primary) hypertension-continues on losartan, metoprolol succinate 50 mg daily.  She did not take her medication prior to her visit today but tells me her blood pressure is generally in the 120s over 70s.  I have asked her to take her medications prior to her next follow-up so we can accurately assess.  Will not titrate  therapy today in the event that she does have severe aortic valve stenosis.  Mixed hyperlipidemia-her most recent lipid panel from April shows an LDL of 198, triglycerides 73, HDL 70.  We discussed that her LDL is entirely too high and she should resume atorvastatin as recommended by her primary care physician, I agree.  She did not recall the dose of her atorvastatin but on record review it appears atorvastatin was 80 mg daily and recently refilled.  I have recommended that she resume this today and monitor for side effects.  She would qualify for PCSK9 inhibitor given her history of NSTEMI with PCI and we should consider this if she has statin myalgias.  Snoring - Plan: Home sleep test -Possible snoring however more prominent symptom is extreme dry mouth.  We will screen with home sleep test given patient's hesitance to complete a in lab sleep study.  Total time of encounter: 40 minutes total time of encounter, including 30 minutes spent in face-to-face patient care on the date of this encounter. This time includes coordination of care and counseling regarding above mentioned  problem list. Remainder of non-face-to-face time involved reviewing chart documents/testing relevant to the patient encounter and documentation in the medical record. I have independently reviewed documentation from referring provider.   Cherlynn Kaiser, MD, [place]   Shared Decision Making/Informed Consent:       Medication Adjustments/Labs and Tests Ordered: Current medicines are reviewed at length with the patient today.  Concerns regarding medicines are outlined above.   No orders of the defined types were placed in this encounter.   No orders of the defined types were placed in this encounter.   There are no Patient Instructions on file for this visit.

## 2021-12-07 DIAGNOSIS — J302 Other seasonal allergic rhinitis: Secondary | ICD-10-CM | POA: Diagnosis not present

## 2021-12-07 DIAGNOSIS — I10 Essential (primary) hypertension: Secondary | ICD-10-CM | POA: Diagnosis not present

## 2021-12-07 DIAGNOSIS — Z0001 Encounter for general adult medical examination with abnormal findings: Secondary | ICD-10-CM | POA: Diagnosis not present

## 2021-12-07 DIAGNOSIS — E119 Type 2 diabetes mellitus without complications: Secondary | ICD-10-CM | POA: Diagnosis not present

## 2021-12-07 DIAGNOSIS — E782 Mixed hyperlipidemia: Secondary | ICD-10-CM | POA: Diagnosis not present

## 2022-03-15 DIAGNOSIS — E782 Mixed hyperlipidemia: Secondary | ICD-10-CM | POA: Diagnosis not present

## 2022-03-15 DIAGNOSIS — E119 Type 2 diabetes mellitus without complications: Secondary | ICD-10-CM | POA: Diagnosis not present

## 2022-03-15 DIAGNOSIS — J302 Other seasonal allergic rhinitis: Secondary | ICD-10-CM | POA: Diagnosis not present

## 2022-03-15 DIAGNOSIS — I10 Essential (primary) hypertension: Secondary | ICD-10-CM | POA: Diagnosis not present

## 2022-03-15 DIAGNOSIS — Z6841 Body Mass Index (BMI) 40.0 and over, adult: Secondary | ICD-10-CM | POA: Diagnosis not present

## 2022-03-15 DIAGNOSIS — Z23 Encounter for immunization: Secondary | ICD-10-CM | POA: Diagnosis not present

## 2022-05-10 DIAGNOSIS — E119 Type 2 diabetes mellitus without complications: Secondary | ICD-10-CM | POA: Diagnosis not present

## 2022-05-10 DIAGNOSIS — I10 Essential (primary) hypertension: Secondary | ICD-10-CM | POA: Diagnosis not present

## 2022-05-10 DIAGNOSIS — J302 Other seasonal allergic rhinitis: Secondary | ICD-10-CM | POA: Diagnosis not present

## 2022-05-10 DIAGNOSIS — E782 Mixed hyperlipidemia: Secondary | ICD-10-CM | POA: Diagnosis not present

## 2022-05-24 ENCOUNTER — Ambulatory Visit (HOSPITAL_COMMUNITY): Payer: Medicare HMO | Attending: Internal Medicine

## 2022-05-24 DIAGNOSIS — I350 Nonrheumatic aortic (valve) stenosis: Secondary | ICD-10-CM

## 2022-05-24 LAB — ECHOCARDIOGRAM COMPLETE
AR max vel: 0.74 cm2
AV Area VTI: 1 cm2
AV Area mean vel: 0.81 cm2
AV Mean grad: 24 mmHg
AV Peak grad: 49.8 mmHg
Ao pk vel: 3.53 m/s
Area-P 1/2: 4.15 cm2
P 1/2 time: 879 msec
S' Lateral: 2.4 cm

## 2022-05-25 ENCOUNTER — Other Ambulatory Visit: Payer: Self-pay

## 2022-05-25 ENCOUNTER — Telehealth: Payer: Self-pay | Admitting: Internal Medicine

## 2022-05-25 DIAGNOSIS — I350 Nonrheumatic aortic (valve) stenosis: Secondary | ICD-10-CM

## 2022-05-25 NOTE — Telephone Encounter (Signed)
Patient was returning call for results. Please advise °

## 2022-05-25 NOTE — Telephone Encounter (Signed)
Called patient, advised of ECHO results.   Calcium score ordered with AORTIC VALVE CALCIUM SCORE in the comments.   I will notify scheduling team to get scheduled for 11/21 and contact patient.  She is upset to continue to have more testing, I did advise this was to assist in her care. Patient agreed to have testing completed.

## 2022-05-26 NOTE — Telephone Encounter (Signed)
Patient was contacted yesterday to schedule Calcium Score, per notes the patient requested a call back in January.

## 2022-05-29 ENCOUNTER — Other Ambulatory Visit: Payer: Self-pay | Admitting: Internal Medicine

## 2022-05-29 DIAGNOSIS — I350 Nonrheumatic aortic (valve) stenosis: Secondary | ICD-10-CM

## 2022-05-29 NOTE — Telephone Encounter (Signed)
Attempted to call patient- unable to reach or leave message at this time. Wanting to confirm when patient would be okay to have Cal score.

## 2022-05-31 ENCOUNTER — Ambulatory Visit: Payer: Medicare HMO | Admitting: Internal Medicine

## 2022-05-31 ENCOUNTER — Ambulatory Visit: Payer: Medicare HMO | Attending: Internal Medicine | Admitting: Internal Medicine

## 2022-05-31 ENCOUNTER — Encounter: Payer: Self-pay | Admitting: Internal Medicine

## 2022-05-31 VITALS — BP 222/118 | HR 80 | Ht 64.0 in | Wt 224.4 lb

## 2022-05-31 DIAGNOSIS — I10 Essential (primary) hypertension: Secondary | ICD-10-CM | POA: Diagnosis not present

## 2022-05-31 DIAGNOSIS — I2510 Atherosclerotic heart disease of native coronary artery without angina pectoris: Secondary | ICD-10-CM

## 2022-05-31 DIAGNOSIS — R011 Cardiac murmur, unspecified: Secondary | ICD-10-CM

## 2022-05-31 DIAGNOSIS — I350 Nonrheumatic aortic (valve) stenosis: Secondary | ICD-10-CM | POA: Diagnosis not present

## 2022-05-31 DIAGNOSIS — I214 Non-ST elevation (NSTEMI) myocardial infarction: Secondary | ICD-10-CM | POA: Diagnosis not present

## 2022-05-31 DIAGNOSIS — E782 Mixed hyperlipidemia: Secondary | ICD-10-CM

## 2022-05-31 DIAGNOSIS — R0683 Snoring: Secondary | ICD-10-CM | POA: Diagnosis not present

## 2022-05-31 NOTE — Progress Notes (Signed)
Cardiology Office Note:    Date: 05/31/22  ID:  Vicki Rogers, DOB 1945-08-13, MRN [id number]  PCP:  Benito Mccreedy, MD  Cardiologist:  Elouise Munroe, MD  Electrophysiologist:  None   Referring MD: Benito Mccreedy, MD   Chief Complaint/Reason for Referral: NSTEMI, HTN, cardiovascular review  History of Present Illness:    Vicki Rogers is a 76 y.o. female with a history of diabetes mellitus type 2, diverticulosis, hypertension, hyperlipidemia and stroke presented for epigastric pain on 04/08/18, and developed elevated troponin and EKG changes, found to have NSTEMI, and received PCI to the mid LAD. Recommended to have 12 months of uninterrupted DAPT. She presents today for follow-up from our last visit for abnormal exam with systolic murmur, and follow up of echo with aortic stenosis. Echo shows moderate-severe AS but may be underestimated due to angulation of aorta. I recommended close follow up echo in 6 mo to continue to assess.   She reported chest pain in March 2023, during a time of significant stress. She does all her own ADLs and functions independently but has to rely on external transportation to get places. She had issues with her house at the time. She has had no recurrence since. We discussed last visit that CP could represent CAD with known history of MI or could be related to progressive AS. I've asked her to report her next episode of chest pain. Previously, I have offered stress testing and she deferred.   She states that she becomes tired with activities such as cooking and walking. It depends on how tired she is feeling whether she pauses her activity or not, but usually she will sit and rest periodically. Denies chest pain or dyspnea.   Her blood pressure is elevated in clinic at 222/118. She endorses her reading was 127/80 before leaving her house this morning. She states she has not taken her medication yet this morning. Upon recheck, her blood pressure in clinic was  170/104. She was rushing to get out of the house today as her appt was rescheduled to an earlier time for unclear reasons to both of [place]. She will go home and take her medications, she most likely needs uptitration of therapy.   Of note, she reveals a skin reaction due to the gel from the last Echo she had under both breasts. She reports that this reaction occurs a few days after each Echo she has, and eventually resolves. D/w chief sonographer who suggested it may be more related to probe cleaning products than gel. I tend to agree. Would advise probe be wiped down with water wipe prior to any future echos.   She denies any palpitations, chest pain, shortness of breath, or peripheral edema. No lightheadedness, headaches, syncope, orthopnea, or PND.  Past Medical History:  Diagnosis Date   Aortic stenosis    Arthritis    Diabetes mellitus without complication ([place])    Diverticulosis    Hypertension    NSTEMI (non-ST elevated myocardial infarction) ([place])    Stroke [facility])     Past Surgical History:  Procedure Laterality Date   ABDOMINAL SURGERY     CORONARY STENT INTERVENTION N/A 04/08/2018   Procedure: CORONARY STENT INTERVENTION;  Surgeon: Sherren Mocha, MD;  Location: [place] [facility];  Service: Cardiovascular;  Laterality: N/A;   LEFT HEART CATH AND CORONARY ANGIOGRAPHY N/A 04/08/2018   Procedure: LEFT HEART CATH AND CORONARY ANGIOGRAPHY;  Surgeon: Sherren Mocha, MD;  Location: [place] [facility];  Service: Cardiovascular;  Laterality: N/A;  Current Medications: Current Meds  Medication Sig   aspirin 81 MG EC tablet Take 1 tablet (81 mg total) by mouth daily.   atorvastatin (LIPITOR) 80 MG tablet Take 80 mg by mouth daily. Take 1 Tablet Daily   carboxymethylcellul-glycerin (REFRESH OPTIVE) 0.5-0.9 % ophthalmic solution Place 1 drop into both eyes as needed for dry eyes.   glipiZIDE (GLUCOTROL XL) 5 MG 24 hr tablet Take 5 mg by mouth daily with breakfast.   losartan (COZAAR) 50  MG tablet Take by mouth.   metoprolol succinate (TOPROL-XL) 50 MG 24 hr tablet Take 50 mg by mouth daily. Take with or immediately following a meal.     Allergies:   Penicillins, Shrimp [shellfish allergy], and Metformin and related   Social History   Tobacco Use   Smoking status: Never   Smokeless tobacco: Never  Vaping Use   Vaping Use: Never used  Substance Use Topics   Alcohol use: No   Drug use: No     Family History: The patient's family history includes Diabetes in her mother.  ROS:   Please see the history of present illness. (+) Exertional fatigue  All other systems reviewed and are negative.  EKGs/Labs/Other Studies Reviewed:    The following studies were reviewed today:  Echo 05/24/2022: 1. Left ventricular ejection fraction, by estimation, is 60 to 65%. The  left ventricle has normal function. The left ventricle has no regional  wall motion abnormalities. Left ventricular diastolic parameters were  normal. The average left ventricular  global longitudinal strain is -20.5 %.   2. Right ventricular systolic function is normal. The right ventricular  size is normal. There is normal pulmonary artery systolic pressure. The  estimated right ventricular systolic pressure is [id number] mmHg.   3. The mitral valve is normal in structure. Trivial mitral valve  regurgitation. No evidence of mitral stenosis.   4. The inferior vena cava is normal in size with greater than 50%  respiratory variability, suggesting right atrial pressure of 3 mmHg.   5. The aortic valve is calcified. There is severe calcifcation of the  aortic valve. Aortic valve regurgitation is trivial. Severe aortic valve  stenosis. Moderate AS by gradients (Vmax 3.5 m/s, MG 31 mmHg), severe AS  by AVA (0.7cm^2) and DI (0.22). Low  SV index (31 cc/m^2), suspect paradoxical low flow low gradient severe AS    EKG: EKG is personally reviewed. 05/31/22: EKG was not ordered. 11/24/21: NSR.  Imaging studies that  I have independently reviewed today: n/a  Recent Labs: No results found for requested labs within last 365 days.  Recent Lipid Panel    Component Value Date/Time   CHOL 134 06/11/2018 0932   TRIG 62 06/11/2018 0932   HDL 57 06/11/2018 0932   CHOLHDL 2.4 06/11/2018 0932   CHOLHDL 3.9 04/07/2018 2321   VLDL 11 04/07/2018 2321   LDLCALC 65 06/11/2018 0932    Physical Exam:    VS:  BP (!) 222/118 (BP Location: Left Arm, Patient Position: Sitting, Cuff Size: Normal)   Pulse 80   Ht 5\' 4"  (1.626 m)   Wt 224 lb 6.4 oz (101.8 kg)   SpO2 97%   BMI 38.52 kg/m    Recheck 170/104 mmHg - radial  Wt Readings from Last 5 Encounters:  05/31/22 224 lb 6.4 oz (101.8 kg)  11/30/21 226 lb 12.8 oz (102.9 kg)  11/24/21 226 lb 9.6 oz (102.8 kg)  04/10/19 217 lb (98.4 kg)  03/03/19 225 lb 1.4 oz (102.1  kg)    Constitutional: No acute distress, using walker Eyes: sclera non-icteric, normal conjunctiva and lids ENMT: normal dentition, moist mucous membranes Cardiovascular: regular rhythm, normal rate, late peaking 3/6 SEM, obscures S2, across precordium. No jugular venous distention.  Respiratory: clear to auscultation bilaterally GI : normal bowel sounds, soft and nontender. No distention.   MSK: extremities warm, well perfused. No edema.  NEURO: grossly nonfocal exam, moves all extremities. PSYCH: alert and oriented x 3, normal mood and affect.   ASSESSMENT:    1. Severe aortic stenosis   2. Nonrheumatic aortic valve stenosis   3. Systolic murmur   4. Non-ST elevation (NSTEMI) myocardial infarction ([facility])   5. Coronary artery disease involving native coronary artery of native heart without angina pectoris   6. Essential (primary) hypertension   7. Mixed hyperlipidemia   8. Snoring    PLAN:    Aortic valve stenosis, severe stage D1 vs D3 -Murmur is suggestive of severe AS.  Severe by echo, interpretation suspicion for paradoxical low flow low gradient, I do wonder if LVOT TVI is just  underestimated. I recommended AV calcium scoring vs structural consult. She would like to pursue structural consult and likely afterward obtaining TAVR CTs for AV calcium scoring and tavr measurements, at the discretion of TAVR team. Symptomatic with fatigue.   Non-ST elevation (NSTEMI) myocardial infarction [facility]) Coronary artery disease involving native coronary artery of native heart without angina pectoris -She had reported chest pain in March 2023 but does not endorse chest pain today.  She continues on aspirin, statin, and metoprolol.  She did have PCI and completed 12 months of DAPT.   Essential (primary) hypertension-continues on losartan, metoprolol succinate 50 mg daily.  BP severely elevated but decreased during visit and she has not taken her medications yet. Home readings are normal per her report.   Mixed hyperlipidemia-her most recent lipid panel from April shows an LDL of 198, triglycerides 73, HDL 70. Tolerating atorvastatin 80 mg daily well, labs reflect her being off of therapy. She likely needs more aggressive therapy, repeat labs at next visit or with PCP.   Snoring -  -Possible snoring however more prominent symptom is extreme dry mouth. Home sleep test was ordered but not completed.  Total time of encounter: 30 minutes total time of encounter, including 20 minutes spent in face-to-face patient care on the date of this encounter. This time includes coordination of care and counseling regarding above mentioned problem list. Remainder of non-face-to-face time involved reviewing chart documents/testing relevant to the patient encounter and documentation in the medical record. I have independently reviewed documentation from referring provider.   Follow up: 6 months.   Weston Brass, MD, [facility]  [facility]    Shared Decision Making/Informed Consent:       Medication Adjustments/Labs and Tests Ordered: Current medicines are reviewed at length with the  patient today.  Concerns regarding medicines are outlined above.   Orders Placed This Encounter  Procedures   Ambulatory referral to Structural Heart/Valve Clinic (only at [facility])    No orders of the defined types were placed in this encounter.   Patient Instructions  Medication Instructions:  No Changes In Medications at this time.  *If you need a refill on your cardiac medications before your next appointment, please call your pharmacy*  Lab Work: None Ordered At This Time.  If you have labs (blood work) drawn today and your tests are completely normal, you will receive your results only by: MyChart  Message (if you have MyChart) OR A paper copy in the mail If you have any lab test that is abnormal or we need to change your treatment, we will call you to review the results.  Testing/Procedures: None Ordered At This Time.   Follow-Up: At [facility], you and your health needs are our priority.  As part of our continuing mission to provide you with exceptional heart care, we have created designated Provider Care Teams.  These Care Teams include your primary Cardiologist (physician) and Advanced Practice Providers (APPs -  Physician Assistants and Nurse Practitioners) who all work together to provide you with the care you need, when you need it.   Your next appointment:   6 month(s)  The format for your next appointment:   In Person  Provider:   Elouise Munroe, MD     Other Instructions REFERRAL TO [place]- (Dr. Burt Knack) SOMEONE WILL REACH OUT TO YOU TO GET YOU SCHEDULED FOR THIS           I,Breanna Adamick,acting as a scribe for Elouise Munroe, MD.,have documented all relevant documentation on the behalf of Elouise Munroe, MD,as directed by  Elouise Munroe, MD while in the presence of Elouise Munroe, MD.  I, Elouise Munroe, MD, have reviewed all documentation for this visit. The documentation on 05/31/22 for the exam,  diagnosis, procedures, and orders are all accurate and complete.

## 2022-05-31 NOTE — Patient Instructions (Addendum)
Medication Instructions:  No Changes In Medications at this time.  *If you need a refill on your cardiac medications before your next appointment, please call your pharmacy*  Lab Work: None Ordered At This Time.  If you have labs (blood work) drawn today and your tests are completely normal, you will receive your results only by: MyChart Message (if you have MyChart) OR A paper copy in the mail If you have any lab test that is abnormal or we need to change your treatment, we will call you to review the results.  Testing/Procedures: None Ordered At This Time.   Follow-Up: At [facility], you and your health needs are our priority.  As part of our continuing mission to provide you with exceptional heart care, we have created designated Provider Care Teams.  These Care Teams include your primary Cardiologist (physician) and Advanced Practice Providers (APPs -  Physician Assistants and Nurse Practitioners) who all work together to provide you with the care you need, when you need it.   Your next appointment:   6 month(s)  The format for your next appointment:   In Person  Provider:   Parke Poisson, MD     Other Instructions REFERRAL TO HEART VALVE CLINIC- (Dr. Excell Seltzer) SOMEONE WILL REACH OUT TO YOU TO GET YOU SCHEDULED FOR THIS

## 2022-06-14 DIAGNOSIS — E782 Mixed hyperlipidemia: Secondary | ICD-10-CM | POA: Diagnosis not present

## 2022-06-14 DIAGNOSIS — J302 Other seasonal allergic rhinitis: Secondary | ICD-10-CM | POA: Diagnosis not present

## 2022-06-14 DIAGNOSIS — I10 Essential (primary) hypertension: Secondary | ICD-10-CM | POA: Diagnosis not present

## 2022-06-14 DIAGNOSIS — K219 Gastro-esophageal reflux disease without esophagitis: Secondary | ICD-10-CM | POA: Diagnosis not present

## 2022-06-14 DIAGNOSIS — E119 Type 2 diabetes mellitus without complications: Secondary | ICD-10-CM | POA: Diagnosis not present

## 2022-06-26 ENCOUNTER — Ambulatory Visit: Payer: Medicare HMO | Admitting: Cardiovascular Disease

## 2022-06-30 ENCOUNTER — Ambulatory Visit: Payer: Medicare HMO | Admitting: Cardiovascular Disease

## 2022-08-09 ENCOUNTER — Other Ambulatory Visit: Payer: Self-pay

## 2022-08-09 ENCOUNTER — Encounter: Payer: Self-pay | Admitting: Cardiovascular Disease

## 2022-08-09 ENCOUNTER — Ambulatory Visit: Payer: Medicare HMO | Attending: Cardiovascular Disease | Admitting: Cardiovascular Disease

## 2022-08-09 VITALS — BP 130/100 | HR 70 | Ht 64.0 in | Wt 224.4 lb

## 2022-08-09 DIAGNOSIS — I350 Nonrheumatic aortic (valve) stenosis: Secondary | ICD-10-CM

## 2022-08-09 DIAGNOSIS — Z01810 Encounter for preprocedural cardiovascular examination: Secondary | ICD-10-CM

## 2022-08-09 NOTE — Patient Instructions (Signed)
Medication Instructions:  Your physician recommends that you continue on your current medications as directed. Please refer to the Current Medication list given to you today. *If you need a refill on your cardiac medications before your next appointment, please call your pharmacy*  Lab Work: CBC, BNP, BMET today If you have labs (blood work) drawn today and your tests are completely normal, you will receive your results only by: [place] (if you have MyChart) OR A paper copy in the mail If you have any lab test that is abnormal or we need to change your treatment, we will call you to review the results.   Testing/Procedures: TAVR CT scans (you will be contacted to schedule)  Surgical referral with TCTS (you will be contacted to schedule)  R & L heart catheterization Your physician has requested that you have a cardiac catheterization. Cardiac catheterization is used to diagnose and/or treat various heart conditions. Doctors may recommend this procedure for a number of different reasons. The most common reason is to evaluate chest pain. Chest pain can be a symptom of coronary artery disease (CAD), and cardiac catheterization can show whether plaque is narrowing or blocking your heart's arteries. This procedure is also used to evaluate the valves, as well as measure the blood flow and oxygen levels in different parts of your heart. For further information please visit HugeFiesta.tn. Please follow instruction sheet, as given.  Follow-Up: At [facility], you and your health needs are our priority.  As part of our continuing mission to provide you with exceptional heart care, we have created designated Provider Care Teams.  These Care Teams include your primary Cardiologist (physician) and Advanced Practice Providers (APPs -  Physician Assistants and Nurse Practitioners) who all work together to provide you with the care you need, when you need it.  Your next appointment:    Structural Team will follow-up  Provider:   Sherren Mocha, MD      Other Instructions       Cardiac/Peripheral Catheterization   You are scheduled for a Cardiac Catheterization on Friday, February 9 with Dr. Sherren Mocha.  1. Please arrive at the Main Entrance A at [facility]: [place], [place] [address] on February 9 at 7:00 AM (This time is two hours before your procedure to ensure your preparation). Free valet parking service is available. You will check in at ADMITTING. The support person will be asked to wait in the waiting room.  It is OK to have someone drop you off and come back when you are ready to be discharged.        Special note: Every effort is made to have your procedure done on time. Please understand that emergencies sometimes delay scheduled procedures.   . 2. Diet: Do not eat solid foods after midnight.  You may have clear liquids until 5 AM the day of the procedure.  3. Labs: DONE  4. Medication instructions in preparation for your procedure:   Contrast Allergy: No  DO NOT TAKE AZOR (Olmesartan/Amlodipine) the day before or day of procedure DO NOT TAKE Glipizide the day of procedure  On the morning of your procedure, take Aspirin 81 mg and any morning medicines NOT listed above.  You may use sips of water.  5. Plan to go home the same day, you will only stay overnight if medically necessary. 6. You MUST have a responsible adult to drive you home. 7. An adult MUST be with you the first 66  hours after you arrive home. 8. Bring a current list of your medications, and the last time and date medication taken. 9. Bring ID and current insurance cards. 10.Please wear clothes that are easy to get on and off and wear slip-on shoes.  Thank you for allowing [place] to care for you!   -- [facility]

## 2022-08-09 NOTE — Progress Notes (Signed)
Pre Surgical Assessment: 5 M Walk Test  58M=16.44ft  5 Meter Walk Test- trial 1: 10.08 seconds 5 Meter Walk Test- trial 2: 9.52 seconds 5 Meter Walk Test- trial 3: 9.32 seconds 5 Meter Walk Test Average: 9.64 seconds

## 2022-08-09 NOTE — H&P (View-Only) (Signed)
Cardiology Office Note:    Date:  08/14/2022   ID:  Vicki Rogers, DOB 08/13/1945, MRN [id number]  PCP:  Benito Mccreedy, MD   [place] Providers Cardiologist:  Elouise Munroe, MD     Referring MD: Benito Mccreedy, MD   Chief Complaint  Patient presents with   Aortic Stenosis    History of Present Illness:    Vicki Rogers is a 77 y.o. female referred by Dr Margaretann Loveless for evaluation of aortic stenosis.   The patient is here alone today.  I remember her well from previous encounters when I cared for her late husband.  Her cardiovascular history includes the presence of type 2 diabetes, hypertension, mixed hyperlipidemia, and stroke.  She also has coronary artery disease and presented with non-STEMI in 2019, treated with PCI of the LAD.  She has been noted to have a systolic murmur for at least the last few years.  She recently underwent an echocardiogram revealing progressive changes of aortic stenosis with gradient and velocity data demonstrating moderately severe aortic stenosis with a mean gradient of 32 mmHg, but a dimensionless index of less than 0.25 and a calculated valve area of 0.7 cm.  The patient's clinical symptoms include fatigue and exercise intolerance.  She has some limitations related to balance problems.  The patient ambulates with a walker or a cane.  She does not experience exertional dyspnea or chest discomfort, but admits that her activity level is diminished from her noncardiac problems.  She has had no lightheadedness or frank syncope.  She denies orthopnea, PND, or leg swelling.  She has had trouble with blood pressure control over the years.  Past Medical History:  Diagnosis Date   Aortic stenosis    Arthritis    Diabetes mellitus without complication ([place])    Diverticulosis    Hypertension    NSTEMI (non-ST elevated myocardial infarction) ([place])    Stroke [facility])     Past Surgical History:  Procedure Laterality Date   ABDOMINAL SURGERY      CORONARY STENT INTERVENTION N/A 04/08/2018   Procedure: CORONARY STENT INTERVENTION;  Surgeon: Sherren Mocha, MD;  Location: [place] CV [facility];  Service: Cardiovascular;  Laterality: N/A;   LEFT HEART CATH AND CORONARY ANGIOGRAPHY N/A 04/08/2018   Procedure: LEFT HEART CATH AND CORONARY ANGIOGRAPHY;  Surgeon: Sherren Mocha, MD;  Location: [place] [facility];  Service: Cardiovascular;  Laterality: N/A;    Current Medications: Current Meds  Medication Sig   amLODipine-olmesartan (AZOR) 5-20 MG tablet Take 1 tablet by mouth daily.   aspirin 81 MG EC tablet Take 1 tablet (81 mg total) by mouth daily.   atorvastatin (LIPITOR) 80 MG tablet Take 80 mg by mouth daily. Take 1 Tablet Daily   carboxymethylcellul-glycerin (REFRESH OPTIVE) 0.5-0.9 % ophthalmic solution Place 1 drop into both eyes as needed for dry eyes.   ezetimibe (ZETIA) 10 MG tablet Take 10 mg by mouth daily.   glipiZIDE (GLUCOTROL XL) 5 MG 24 hr tablet Take 5 mg by mouth daily with breakfast.   metoprolol succinate (TOPROL-XL) 50 MG 24 hr tablet Take 50 mg by mouth daily. Take with or immediately following a meal.   [DISCONTINUED] losartan (COZAAR) 50 MG tablet Take by mouth.     Allergies:   Penicillins, Shrimp [shellfish allergy], and Metformin and related   Social History   Socioeconomic History   Marital status: Widowed    Spouse name: Not on file   Number of children: Not on file  Years of education: Not on file   Highest education level: Not on file  Occupational History   Not on file  Tobacco Use   Smoking status: Never   Smokeless tobacco: Never  Vaping Use   Vaping Use: Never used  Substance and Sexual Activity   Alcohol use: No   Drug use: No   Sexual activity: Not on file  Other Topics Concern   Not on file  Social History Narrative   Not on file   Social Determinants of Health   Financial Resource Strain: Not on file  Food Insecurity: Not on file  Transportation Needs: Not on file  Physical  Activity: Not on file  Stress: Not on file  Social Connections: Not on file     Family History: The patient's family history includes Diabetes in her mother.  ROS:   Please see the history of present illness.    All other systems reviewed and are negative.  EKGs/Labs/Other Studies Reviewed:    The following studies were reviewed today: Echo: 1. Left ventricular ejection fraction, by estimation, is 60 to 65%. The  left ventricle has normal function. The left ventricle has no regional  wall motion abnormalities. Left ventricular diastolic parameters were  normal. The average left ventricular  global longitudinal strain is -20.5 %.   2. Right ventricular systolic function is normal. The right ventricular  size is normal. There is normal pulmonary artery systolic pressure. The  estimated right ventricular systolic pressure is [id number] mmHg.   3. The mitral valve is normal in structure. Trivial mitral valve  regurgitation. No evidence of mitral stenosis.   4. The inferior vena cava is normal in size with greater than 50%  respiratory variability, suggesting right atrial pressure of 3 mmHg.   5. The aortic valve is calcified. There is severe calcifcation of the  aortic valve. Aortic valve regurgitation is trivial. Severe aortic valve  stenosis. Moderate AS by gradients (Vmax 3.5 m/s, MG 31 mmHg), severe AS  by AVA (0.7cm^2) and DI (0.22). Low  SV index (31 cc/m^2), suspect paradoxical low flow low gradient severe AS   Cardiac Cath 2019: Prox RCA to Mid RCA lesion is 30% stenosed. Mid RCA lesion is 50% stenosed. Mid LM to Dist LM lesion is 40% stenosed. Prox LAD to Mid LAD lesion is 90% stenosed. Ost 1st Diag to 1st Diag lesion is 40% stenosed. A drug-eluting stent was successfully placed using a STENT ORSIRO 3.0X22. Post intervention, there is a 0% residual stenosis.   1. Severe mid-LAD stenosis, treated successfully with a 3.0x22 mm Orsiro DES 2. Nonobstructive left main, LCx, and  RCA stenosis, appropriate for aggressive medical therapy   Recommend uninterrupted dual antiplatelet therapy with Aspirin 64m daily and Ticagrelor 973mtwice daily for a minimum of 12 months (ACS - Class I recommendation).    Check 2D Echo for assessment of LV function/wall motion  EKG:  EKG is ordered today.  The ekg ordered today demonstrates NSR 70 bpm, within normal limits  Recent Labs: 08/09/2022: BUN 14; Creatinine, Ser 0.89; Hemoglobin 13.4; NT-Pro BNP 47; Platelets 266; Potassium 4.8; Sodium 142  Recent Lipid Panel    Component Value Date/Time   CHOL 134 06/11/2018 0932   TRIG 62 06/11/2018 0932   HDL 57 06/11/2018 0932   CHOLHDL 2.4 06/11/2018 0932   CHOLHDL 3.9 04/07/2018 2321   VLDL 11 04/07/2018 2321   LDLCALC 65 06/11/2018 0932   Risk Assessment/Calculations:    STS Risk Assessment:   Isolated  AVR: Procedure Type: Isolated AVR PERIOPERATIVE OUTCOME ESTIMATE % Operative Mortality 2.26% Morbidity & Mortality 24.9% Stroke 1.84% Renal Failure 3.37% Reoperation 3.92% Prolonged Ventilation 10.7% Deep Sternal Wound Infection 0.075% [place] Hospital Stay (>14 days) 2.91% Short Hospital Stay (<6 days)* 35.2%  Physical Exam:    VS:  BP (!) 130/100   Pulse 70   Ht 5' 4"$  (1.626 m)   Wt 224 lb 6.4 oz (101.8 kg)   SpO2 99%   BMI 38.52 kg/m     Wt Readings from Last 3 Encounters:  08/09/22 224 lb 6.4 oz (101.8 kg)  05/31/22 224 lb 6.4 oz (101.8 kg)  11/30/21 226 lb 12.8 oz (102.9 kg)     GEN:  Well nourished, well developed in no acute distress HEENT: Normal NECK: No JVD; No carotid bruits LYMPHATICS: No lymphadenopathy CARDIAC: RRR, 2/6 harsh late peaking systolic crescendo decrescendo murmur at the RUSB RESPIRATORY:  Clear to auscultation without rales, wheezing or rhonchi  ABDOMEN: Soft, non-tender, non-distended MUSCULOSKELETAL:  No edema; No deformity  SKIN: Warm and dry NEUROLOGIC:  Alert and oriented x 3 PSYCHIATRIC:  Normal affect   ASSESSMENT:     1. Nonrheumatic aortic valve stenosis   2. Pre-procedural cardiovascular examination    PLAN:    In order of problems listed above:  The patient may have severe, stage C aortic stenosis.  Her functional capacity is reduced because of gait instability, so difficult to know if there is a true functional limitation related to aortic stenosis.  Her exam and echo have suggestive features of severe aortic stenosis.  The patient's echo demonstrates preserved LVEF, normal RV function, moderately elevated transaortic gradients with a V-max of 3.5 m/s and mean gradient of 31 mmHg, but low dimensionless index of 0.22 with a calculated aortic valve area of 0.7 cm.  I reviewed the natural history of aortic stenosis with the patient today.  We discussed potential treatment options which include continued surveillance, surgical aortic valve replacement, or transcatheter aortic valve replacement.  Considering her age and reduced functional capacity, transcatheter aortic valve replacement would be a reasonable treatment option for this patient pending further evaluation.  Will plan on the following diagnostic studies to determine whether to move forward with valve intervention versus a strategy of continued clinical and imaging surveillance: BNP CTA studies to assess anatomic feasibility of transfemoral TAVR, give aortic valve calcium score, and provide information on valve sizing Right/left heart catheterization for hemodynamic assessment and reevaluation of coronary anatomy in this patient with known CAD and prior LAD stenting. I have reviewed the risks, indications, and alternatives to cardiac catheterization, possible angioplasty, and stenting with the patient. Risks include but are not limited to bleeding, infection, vascular injury, stroke, myocardial infection, arrhythmia, kidney injury, radiation-related injury in the case of prolonged fluoroscopy use, emergency cardiac surgery, and death. The patient  understands the risks of serious complication is 1-2 in [id number] with diagnostic cardiac cath and 1-2% or less with angioplasty/stenting.     Once her lab and imaging studies are completed, her case will be reviewed with our multidisciplinary heart valve team and we will discuss treatment options further.  Shared Decision Making/Informed Consent The risks [stroke (1 in 1000), death (1 in 1000), kidney failure [usually temporary] (1 in 500), bleeding (1 in 200), allergic reaction [possibly serious] (1 in 200)], benefits (diagnostic support and management of coronary artery disease) and alternatives of a cardiac catheterization were discussed in detail with Ms. Seccombe and she is willing to proceed.  Medication Adjustments/Labs and Tests Ordered: Current medicines are reviewed at length with the patient today.  Concerns regarding medicines are outlined above.  Orders Placed This Encounter  Procedures   CBC   Basic metabolic panel   Pro b natriuretic peptide (BNP)   EKG 12-Lead   No orders of the defined types were placed in this encounter.   Patient Instructions  Medication Instructions:  Your physician recommends that you continue on your current medications as directed. Please refer to the Current Medication list given to you today. *If you need a refill on your cardiac medications before your next appointment, please call your pharmacy*  Lab Work: CBC, BNP, BMET today If you have labs (blood work) drawn today and your tests are completely normal, you will receive your results only by: [place] (if you have MyChart) OR A paper copy in the mail If you have any lab test that is abnormal or we need to change your treatment, we will call you to review the results.   Testing/Procedures: TAVR CT scans (you will be contacted to schedule)  Surgical referral with TCTS (you will be contacted to schedule)  R & L heart catheterization Your physician has requested that you have a cardiac  catheterization. Cardiac catheterization is used to diagnose and/or treat various heart conditions. Doctors may recommend this procedure for a number of different reasons. The most common reason is to evaluate chest pain. Chest pain can be a symptom of coronary artery disease (CAD), and cardiac catheterization can show whether plaque is narrowing or blocking your heart's arteries. This procedure is also used to evaluate the valves, as well as measure the blood flow and oxygen levels in different parts of your heart. For further information please visit HugeFiesta.[email or web address]. Please follow instruction sheet, as given.  Follow-Up: At [facility], you and your health needs are our priority.  As part of our continuing mission to provide you with exceptional heart care, we have created designated Provider Care Teams.  These Care Teams include your primary Cardiologist (physician) and Advanced Practice Providers (APPs -  Physician Assistants and Nurse Practitioners) who all work together to provide you with the care you need, when you need it.  Your next appointment:   Structural Team will follow-up  Provider:   Sherren Mocha, MD      Other Instructions       Cardiac/Peripheral Catheterization   You are scheduled for a Cardiac Catheterization on Friday, February 9 with Dr. Sherren Mocha.  1. Please arrive at the Main Entrance A at [facility]: [place], [place] [address] on February 9 at 7:00 AM (This time is two hours before your procedure to ensure your preparation). Free valet parking service is available. You will check in at ADMITTING. The support person will be asked to wait in the waiting room.  It is OK to have someone drop you off and come back when you are ready to be discharged.        Special note: Every effort is made to have your procedure done on time. Please understand that emergencies sometimes delay scheduled procedures.   . 2. Diet: Do not eat  solid foods after midnight.  You may have clear liquids until 5 AM the day of the procedure.  3. Labs: DONE  4. Medication instructions in preparation for your procedure:   Contrast Allergy: No  DO NOT TAKE AZOR (Olmesartan/Amlodipine) the day before or day of procedure DO NOT TAKE Glipizide  the day of procedure  On the morning of your procedure, take Aspirin 81 mg and any morning medicines NOT listed above.  You may use sips of water.  5. Plan to go home the same day, you will only stay overnight if medically necessary. 6. You MUST have a responsible adult to drive you home. 7. An adult MUST be with you the first 24 hours after you arrive home. 8. Bring a current list of your medications, and the last time and date medication taken. 9. Bring ID and current insurance cards. 10.Please wear clothes that are easy to get on and off and wear slip-on shoes.  Thank you for allowing [place] to care for you!   -- [facility]    Signed, Sherren Mocha, MD  08/14/2022 3:48 PM    [place]

## 2022-08-09 NOTE — Progress Notes (Signed)
Cardiology Office Note:    Date:  08/14/2022   ID:  Vicki Rogers, DOB 08/13/1945, MRN [id number]  PCP:  Benito Mccreedy, MD   [place] Providers Cardiologist:  Elouise Munroe, MD     Referring MD: Benito Mccreedy, MD   Chief Complaint  Patient presents with   Aortic Stenosis    History of Present Illness:    Vicki Rogers is a 77 y.o. female referred by Dr Margaretann Loveless for evaluation of aortic stenosis.   The patient is here alone today.  I remember her well from previous encounters when I cared for her late husband.  Her cardiovascular history includes the presence of type 2 diabetes, hypertension, mixed hyperlipidemia, and stroke.  She also has coronary artery disease and presented with non-STEMI in 2019, treated with PCI of the LAD.  She has been noted to have a systolic murmur for at least the last few years.  She recently underwent an echocardiogram revealing progressive changes of aortic stenosis with gradient and velocity data demonstrating moderately severe aortic stenosis with a mean gradient of 32 mmHg, but a dimensionless index of less than 0.25 and a calculated valve area of 0.7 cm.  The patient's clinical symptoms include fatigue and exercise intolerance.  She has some limitations related to balance problems.  The patient ambulates with a walker or a cane.  She does not experience exertional dyspnea or chest discomfort, but admits that her activity level is diminished from her noncardiac problems.  She has had no lightheadedness or frank syncope.  She denies orthopnea, PND, or leg swelling.  She has had trouble with blood pressure control over the years.  Past Medical History:  Diagnosis Date   Aortic stenosis    Arthritis    Diabetes mellitus without complication ([place])    Diverticulosis    Hypertension    NSTEMI (non-ST elevated myocardial infarction) ([place])    Stroke [facility])     Past Surgical History:  Procedure Laterality Date   ABDOMINAL SURGERY      CORONARY STENT INTERVENTION N/A 04/08/2018   Procedure: CORONARY STENT INTERVENTION;  Surgeon: Sherren Mocha, MD;  Location: [place] [facility];  Service: Cardiovascular;  Laterality: N/A;   LEFT HEART CATH AND CORONARY ANGIOGRAPHY N/A 04/08/2018   Procedure: LEFT HEART CATH AND CORONARY ANGIOGRAPHY;  Surgeon: Sherren Mocha, MD;  Location: [place] [facility];  Service: Cardiovascular;  Laterality: N/A;    Current Medications: Current Meds  Medication Sig   amLODipine-olmesartan (AZOR) 5-20 MG tablet Take 1 tablet by mouth daily.   aspirin 81 MG EC tablet Take 1 tablet (81 mg total) by mouth daily.   atorvastatin (LIPITOR) 80 MG tablet Take 80 mg by mouth daily. Take 1 Tablet Daily   carboxymethylcellul-glycerin (REFRESH OPTIVE) 0.5-0.9 % ophthalmic solution Place 1 drop into both eyes as needed for dry eyes.   ezetimibe (ZETIA) 10 MG tablet Take 10 mg by mouth daily.   glipiZIDE (GLUCOTROL XL) 5 MG 24 hr tablet Take 5 mg by mouth daily with breakfast.   metoprolol succinate (TOPROL-XL) 50 MG 24 hr tablet Take 50 mg by mouth daily. Take with or immediately following a meal.   [DISCONTINUED] losartan (COZAAR) 50 MG tablet Take by mouth.     Allergies:   Penicillins, Shrimp [shellfish allergy], and Metformin and related   Social History   Socioeconomic History   Marital status: Widowed    Spouse name: Not on file   Number of children: Not on file  Years of education: Not on file   Highest education level: Not on file  Occupational History   Not on file  Tobacco Use   Smoking status: Never   Smokeless tobacco: Never  Vaping Use   Vaping Use: Never used  Substance and Sexual Activity   Alcohol use: No   Drug use: No   Sexual activity: Not on file  Other Topics Concern   Not on file  Social History Narrative   Not on file   Social Determinants of Health   Financial Resource Strain: Not on file  Food Insecurity: Not on file  Transportation Needs: Not on file  Physical  Activity: Not on file  Stress: Not on file  Social Connections: Not on file     Family History: The patient's family history includes Diabetes in her mother.  ROS:   Please see the history of present illness.    All other systems reviewed and are negative.  EKGs/Labs/Other Studies Reviewed:    The following studies were reviewed today: Echo: 1. Left ventricular ejection fraction, by estimation, is 60 to 65%. The  left ventricle has normal function. The left ventricle has no regional  wall motion abnormalities. Left ventricular diastolic parameters were  normal. The average left ventricular  global longitudinal strain is -20.5 %.   2. Right ventricular systolic function is normal. The right ventricular  size is normal. There is normal pulmonary artery systolic pressure. The  estimated right ventricular systolic pressure is [id number] mmHg.   3. The mitral valve is normal in structure. Trivial mitral valve  regurgitation. No evidence of mitral stenosis.   4. The inferior vena cava is normal in size with greater than 50%  respiratory variability, suggesting right atrial pressure of 3 mmHg.   5. The aortic valve is calcified. There is severe calcifcation of the  aortic valve. Aortic valve regurgitation is trivial. Severe aortic valve  stenosis. Moderate AS by gradients (Vmax 3.5 m/s, MG 31 mmHg), severe AS  by AVA (0.7cm^2) and DI (0.22). Low  SV index (31 cc/m^2), suspect paradoxical low flow low gradient severe AS   Cardiac Cath 2019: Prox RCA to Mid RCA lesion is 30% stenosed. Mid RCA lesion is 50% stenosed. Mid LM to Dist LM lesion is 40% stenosed. Prox LAD to Mid LAD lesion is 90% stenosed. Ost 1st Diag to 1st Diag lesion is 40% stenosed. A drug-eluting stent was successfully placed using a STENT ORSIRO 3.0X22. Post intervention, there is a 0% residual stenosis.   1. Severe mid-LAD stenosis, treated successfully with a 3.0x22 mm Orsiro DES 2. Nonobstructive left main, LCx, and  RCA stenosis, appropriate for aggressive medical therapy   Recommend uninterrupted dual antiplatelet therapy with Aspirin 64m daily and Ticagrelor 973mtwice daily for a minimum of 12 months (ACS - Class I recommendation).    Check 2D Echo for assessment of LV function/wall motion  EKG:  EKG is ordered today.  The ekg ordered today demonstrates NSR 70 bpm, within normal limits  Recent Labs: 08/09/2022: BUN 14; Creatinine, Ser 0.89; Hemoglobin 13.4; NT-Pro BNP 47; Platelets 266; Potassium 4.8; Sodium 142  Recent Lipid Panel    Component Value Date/Time   CHOL 134 06/11/2018 0932   TRIG 62 06/11/2018 0932   HDL 57 06/11/2018 0932   CHOLHDL 2.4 06/11/2018 0932   CHOLHDL 3.9 04/07/2018 2321   VLDL 11 04/07/2018 2321   LDLCALC 65 06/11/2018 0932   Risk Assessment/Calculations:    STS Risk Assessment:   Isolated  AVR: Procedure Type: Isolated AVR PERIOPERATIVE OUTCOME ESTIMATE % Operative Mortality 2.26% Morbidity & Mortality 24.9% Stroke 1.84% Renal Failure 3.37% Reoperation 3.92% Prolonged Ventilation 10.7% Deep Sternal Wound Infection 0.075% [place] Hospital Stay (>14 days) 2.91% Short Hospital Stay (<6 days)* 35.2%  Physical Exam:    VS:  BP (!) 130/100   Pulse 70   Ht 5' 4"$  (1.626 m)   Wt 224 lb 6.4 oz (101.8 kg)   SpO2 99%   BMI 38.52 kg/m     Wt Readings from Last 3 Encounters:  08/09/22 224 lb 6.4 oz (101.8 kg)  05/31/22 224 lb 6.4 oz (101.8 kg)  11/30/21 226 lb 12.8 oz (102.9 kg)     GEN:  Well nourished, well developed in no acute distress HEENT: Normal NECK: No JVD; No carotid bruits LYMPHATICS: No lymphadenopathy CARDIAC: RRR, 2/6 harsh late peaking systolic crescendo decrescendo murmur at the RUSB RESPIRATORY:  Clear to auscultation without rales, wheezing or rhonchi  ABDOMEN: Soft, non-tender, non-distended MUSCULOSKELETAL:  No edema; No deformity  SKIN: Warm and dry NEUROLOGIC:  Alert and oriented x 3 PSYCHIATRIC:  Normal affect   ASSESSMENT:     1. Nonrheumatic aortic valve stenosis   2. Pre-procedural cardiovascular examination    PLAN:    In order of problems listed above:  The patient may have severe, stage C aortic stenosis.  Her functional capacity is reduced because of gait instability, so difficult to know if there is a true functional limitation related to aortic stenosis.  Her exam and echo have suggestive features of severe aortic stenosis.  The patient's echo demonstrates preserved LVEF, normal RV function, moderately elevated transaortic gradients with a V-max of 3.5 m/s and mean gradient of 31 mmHg, but low dimensionless index of 0.22 with a calculated aortic valve area of 0.7 cm.  I reviewed the natural history of aortic stenosis with the patient today.  We discussed potential treatment options which include continued surveillance, surgical aortic valve replacement, or transcatheter aortic valve replacement.  Considering her age and reduced functional capacity, transcatheter aortic valve replacement would be a reasonable treatment option for this patient pending further evaluation.  Will plan on the following diagnostic studies to determine whether to move forward with valve intervention versus a strategy of continued clinical and imaging surveillance: BNP CTA studies to assess anatomic feasibility of transfemoral TAVR, give aortic valve calcium score, and provide information on valve sizing Right/left heart catheterization for hemodynamic assessment and reevaluation of coronary anatomy in this patient with known CAD and prior LAD stenting. I have reviewed the risks, indications, and alternatives to cardiac catheterization, possible angioplasty, and stenting with the patient. Risks include but are not limited to bleeding, infection, vascular injury, stroke, myocardial infection, arrhythmia, kidney injury, radiation-related injury in the case of prolonged fluoroscopy use, emergency cardiac surgery, and death. The patient  understands the risks of serious complication is 1-2 in [id number] with diagnostic cardiac cath and 1-2% or less with angioplasty/stenting.     Once her lab and imaging studies are completed, her case will be reviewed with our multidisciplinary heart valve team and we will discuss treatment options further.  Shared Decision Making/Informed Consent The risks [stroke (1 in 1000), death (1 in 1000), kidney failure [usually temporary] (1 in 500), bleeding (1 in 200), allergic reaction [possibly serious] (1 in 200)], benefits (diagnostic support and management of coronary artery disease) and alternatives of a cardiac catheterization were discussed in detail with Ms. Seccombe and she is willing to proceed.  Medication Adjustments/Labs and Tests Ordered: Current medicines are reviewed at length with the patient today.  Concerns regarding medicines are outlined above.  Orders Placed This Encounter  Procedures   CBC   Basic metabolic panel   Pro b natriuretic peptide (BNP)   EKG 12-Lead   No orders of the defined types were placed in this encounter.   Patient Instructions  Medication Instructions:  Your physician recommends that you continue on your current medications as directed. Please refer to the Current Medication list given to you today. *If you need a refill on your cardiac medications before your next appointment, please call your pharmacy*  Lab Work: CBC, BNP, BMET today If you have labs (blood work) drawn today and your tests are completely normal, you will receive your results only by: [place] (if you have MyChart) OR A paper copy in the mail If you have any lab test that is abnormal or we need to change your treatment, we will call you to review the results.   Testing/Procedures: TAVR CT scans (you will be contacted to schedule)  Surgical referral with [facility] (you will be contacted to schedule)  R & L heart catheterization Your physician has requested that you have a cardiac  catheterization. Cardiac catheterization is used to diagnose and/or treat various heart conditions. Doctors may recommend this procedure for a number of different reasons. The most common reason is to evaluate chest pain. Chest pain can be a symptom of coronary artery disease (CAD), and cardiac catheterization can show whether plaque is narrowing or blocking your heart's arteries. This procedure is also used to evaluate the valves, as well as measure the blood flow and oxygen levels in different parts of your heart. For further information please visit [email or web address]. Please follow instruction sheet, as given.  Follow-Up: At [facility], you and your health needs are our priority.  As part of our continuing mission to provide you with exceptional heart care, we have created designated Provider Care Teams.  These Care Teams include your primary Cardiologist (physician) and Advanced Practice Providers (APPs -  Physician Assistants and Nurse Practitioners) who all work together to provide you with the care you need, when you need it.  Your next appointment:   Structural Team will follow-up  Provider:   Sherren Mocha, MD      Other Instructions       Cardiac/Peripheral Catheterization   You are scheduled for a Cardiac Catheterization on Friday, February 9 with Dr. Sherren Mocha.  1. Please arrive at the Main Entrance A at [facility]: [place], [place] [address] on February 9 at 7:00 AM (This time is two hours before your procedure to ensure your preparation). Free valet parking service is available. You will check in at ADMITTING. The support person will be asked to wait in the waiting room.  It is OK to have someone drop you off and come back when you are ready to be discharged.        Special note: Every effort is made to have your procedure done on time. Please understand that emergencies sometimes delay scheduled procedures.   . 2. Diet: Do not eat  solid foods after midnight.  You may have clear liquids until 5 AM the day of the procedure.  3. Labs: DONE  4. Medication instructions in preparation for your procedure:   Contrast Allergy: No  DO NOT TAKE AZOR (Olmesartan/Amlodipine) the day before or day of procedure DO NOT TAKE Glipizide  the day of procedure  On the morning of your procedure, take Aspirin 81 mg and any morning medicines NOT listed above.  You may use sips of water.  5. Plan to go home the same day, you will only stay overnight if medically necessary. 6. You MUST have a responsible adult to drive you home. 7. An adult MUST be with you the first 24 hours after you arrive home. 8. Bring a current list of your medications, and the last time and date medication taken. 9. Bring ID and current insurance cards. 10.Please wear clothes that are easy to get on and off and wear slip-on shoes.  Thank you for allowing [place] to care for you!   -- [facility] Invasive Cardiovascular services    Signed, Sherren Mocha, MD  08/14/2022 3:48 PM    [place]

## 2022-08-10 LAB — BASIC METABOLIC PANEL
BUN/Creatinine Ratio: 16 (ref 12–28)
BUN: 14 mg/dL (ref 8–27)
CO2: 21 mmol/L (ref 20–29)
Calcium: 9.8 mg/dL (ref 8.7–10.3)
Chloride: 105 mmol/L (ref 96–106)
Creatinine, Ser: 0.89 mg/dL (ref 0.57–1.00)
Glucose: 77 mg/dL (ref 70–99)
Potassium: 4.8 mmol/L (ref 3.5–5.2)
Sodium: 142 mmol/L (ref 134–144)
eGFR: 68 mL/min/{1.73_m2} (ref >59)

## 2022-08-10 LAB — CBC
Hematocrit: 39.8 % (ref 34.0–46.6)
Hemoglobin: 13.4 g/dL (ref 11.1–15.9)
MCH: 28.3 pg (ref 26.6–33.0)
MCHC: 33.7 g/dL (ref 31.5–35.7)
MCV: 84 fL (ref 79–97)
Platelets: 266 10*3/uL (ref 150–450)
RBC: 4.73 x10E6/uL (ref 3.77–5.28)
RDW: 14.4 % (ref 11.7–15.4)
WBC: 6.8 10*3/uL (ref 3.4–10.8)

## 2022-08-10 LAB — PRO B NATRIURETIC PEPTIDE: NT-Pro BNP: 47 pg/mL (ref 0–738)

## 2022-08-14 ENCOUNTER — Ambulatory Visit (HOSPITAL_COMMUNITY)
Admission: RE | Admit: 2022-08-14 | Discharge: 2022-08-14 | Disposition: A | Discharge status: Home / Self Care | Payer: Medicare HMO | Source: Ambulatory Visit | Attending: Cardiovascular Disease | Admitting: Cardiovascular Disease

## 2022-08-14 DIAGNOSIS — I7143 Infrarenal abdominal aortic aneurysm, without rupture: Secondary | ICD-10-CM | POA: Diagnosis not present

## 2022-08-14 DIAGNOSIS — I350 Nonrheumatic aortic (valve) stenosis: Secondary | ICD-10-CM | POA: Diagnosis not present

## 2022-08-14 MED ORDER — IOHEXOL 350 MG/ML SOLN
100.0000 mL | Freq: Once | INTRAVENOUS | Status: AC | PRN
  Administered 2022-08-14: 100 mL via INTRAVENOUS

## 2022-08-16 ENCOUNTER — Telehealth: Payer: Self-pay | Admitting: Cardiovascular Disease

## 2022-08-16 NOTE — Telephone Encounter (Signed)
Patient is calling to receive her CT results

## 2022-08-16 NOTE — Telephone Encounter (Signed)
Called patient explained once the results are released someone will contact her. Patient voiced understanding.

## 2022-08-17 ENCOUNTER — Telehealth: Payer: Self-pay | Admitting: *Deleted

## 2022-08-17 NOTE — Telephone Encounter (Addendum)
Cardiac Catheterization scheduled at [facility] for: Friday August 18, 2022 9 AM Arrival time and place: [facility] Main Entrance A at: 7 AM  Nothing to eat after midnight prior to procedure, clear liquids until 5 AM day of procedure.  Medication instructions: -Hold:  Glipizide-AM of procedure -Other usual morning medications can be taken with sips of water including aspirin 81 mg.  Confirmed patient has responsible adult to drive home post procedure and be with patient first 24 hours after arriving home.  Patient reports no new symptoms concerning for COVID-19 in the past 10 days.  Reviewed procedure instructions with patient.  Patient reports to her knowledge she is not allergic to IV contrast.  Patient aware Dr Burt Knack has reviewed CT results from 08/14/22, awaiting cath results,  then will review all imaging information and discuss treatment options.

## 2022-08-18 ENCOUNTER — Ambulatory Visit (HOSPITAL_COMMUNITY)
Admission: RE | Admit: 2022-08-18 | Discharge: 2022-08-18 | Disposition: A | Discharge status: Home / Self Care | Payer: Medicare HMO | Attending: Cardiovascular Disease | Admitting: Cardiovascular Disease

## 2022-08-18 ENCOUNTER — Encounter (HOSPITAL_COMMUNITY): Payer: Self-pay | Admitting: Cardiovascular Disease

## 2022-08-18 ENCOUNTER — Encounter (HOSPITAL_COMMUNITY)
Admission: RE | Disposition: A | Discharge status: Home / Self Care | Payer: Self-pay | Source: Home / Self Care | Attending: Cardiovascular Disease

## 2022-08-18 ENCOUNTER — Other Ambulatory Visit: Payer: Self-pay

## 2022-08-18 DIAGNOSIS — I2510 Atherosclerotic heart disease of native coronary artery without angina pectoris: Secondary | ICD-10-CM | POA: Insufficient documentation

## 2022-08-18 DIAGNOSIS — E782 Mixed hyperlipidemia: Secondary | ICD-10-CM | POA: Diagnosis not present

## 2022-08-18 DIAGNOSIS — I350 Nonrheumatic aortic (valve) stenosis: Secondary | ICD-10-CM | POA: Diagnosis not present

## 2022-08-18 DIAGNOSIS — Z7984 Long term (current) use of oral hypoglycemic drugs: Secondary | ICD-10-CM | POA: Diagnosis not present

## 2022-08-18 DIAGNOSIS — E119 Type 2 diabetes mellitus without complications: Secondary | ICD-10-CM | POA: Insufficient documentation

## 2022-08-18 DIAGNOSIS — Z955 Presence of coronary angioplasty implant and graft: Secondary | ICD-10-CM | POA: Diagnosis not present

## 2022-08-18 DIAGNOSIS — Z8673 Personal history of transient ischemic attack (TIA), and cerebral infarction without residual deficits: Secondary | ICD-10-CM | POA: Insufficient documentation

## 2022-08-18 DIAGNOSIS — I10 Essential (primary) hypertension: Secondary | ICD-10-CM | POA: Insufficient documentation

## 2022-08-18 DIAGNOSIS — I252 Old myocardial infarction: Secondary | ICD-10-CM | POA: Insufficient documentation

## 2022-08-18 HISTORY — PX: RIGHT/LEFT HEART CATH AND CORONARY ANGIOGRAPHY: CATH118266

## 2022-08-18 LAB — GLUCOSE, CAPILLARY: Glucose-Capillary: 82 mg/dL (ref 70–99)

## 2022-08-18 LAB — POCT I-STAT EG7
Acid-base deficit: 1 mmol/L (ref 0.0–2.0)
Acid-base deficit: 1 mmol/L (ref 0.0–2.0)
Bicarbonate: 24.9 mmol/L (ref 20.0–28.0)
Bicarbonate: 25.1 mmol/L (ref 20.0–28.0)
Calcium, Ion: 1.32 mmol/L (ref 1.15–1.40)
Calcium, Ion: 1.34 mmol/L (ref 1.15–1.40)
HCT: 36 % (ref 36.0–46.0)
HCT: 36 % (ref 36.0–46.0)
Hemoglobin: 12.2 g/dL (ref 12.0–15.0)
Hemoglobin: 12.2 g/dL (ref 12.0–15.0)
O2 Saturation: 73 %
O2 Saturation: 74 %
Potassium: 4 mmol/L (ref 3.5–5.1)
Potassium: 4 mmol/L (ref 3.5–5.1)
Sodium: 142 mmol/L (ref 135–145)
Sodium: 143 mmol/L (ref 135–145)
TCO2: 26 mmol/L (ref 22–32)
TCO2: 27 mmol/L (ref 22–32)
pCO2, Ven: 45.9 mmHg (ref 44–60)
pCO2, Ven: 46.1 mmHg (ref 44–60)
pH, Ven: 7.342 (ref 7.25–7.43)
pH, Ven: 7.345 (ref 7.25–7.43)
pO2, Ven: 41 mmHg (ref 32–45)
pO2, Ven: 42 mmHg (ref 32–45)

## 2022-08-18 SURGERY — RIGHT/LEFT HEART CATH AND CORONARY ANGIOGRAPHY
Anesthesia: LOCAL

## 2022-08-18 MED ORDER — SODIUM CHLORIDE 0.9% FLUSH: 3.0000 mL | INTRAVENOUS | Status: DC | PRN

## 2022-08-18 MED ORDER — HEPARIN SODIUM (PORCINE) 1000 UNIT/ML IJ SOLN
INTRAMUSCULAR | Status: AC
  Filled 2022-08-18: qty 10

## 2022-08-18 MED ORDER — ASPIRIN 81 MG PO CHEW: 81.0000 mg | CHEWABLE_TABLET | ORAL | Status: DC

## 2022-08-18 MED ORDER — SODIUM CHLORIDE 0.9 % WEIGHT BASED INFUSION
3.0000 mL/kg/h | INTRAVENOUS | Status: AC
  Administered 2022-08-18: 3 mL/kg/h via INTRAVENOUS

## 2022-08-18 MED ORDER — SODIUM CHLORIDE 0.9% FLUSH: 3.0000 mL | Freq: Two times a day (BID) | INTRAVENOUS | Status: DC

## 2022-08-18 MED ORDER — SODIUM CHLORIDE 0.9 % IV SOLN: 250.0000 mL | INTRAVENOUS | Status: DC | PRN

## 2022-08-18 MED ORDER — HEPARIN (PORCINE) IN NACL 1000-0.9 UT/500ML-% IV SOLN
INTRAVENOUS | Status: DC | PRN
  Administered 2022-08-18 (×2): 500 mL

## 2022-08-18 MED ORDER — MIDAZOLAM HCL 2 MG/2ML IJ SOLN
INTRAMUSCULAR | Status: AC
  Filled 2022-08-18: qty 2

## 2022-08-18 MED ORDER — ACETAMINOPHEN 325 MG PO TABS: 650.0000 mg | ORAL_TABLET | ORAL | Status: DC | PRN

## 2022-08-18 MED ORDER — FENTANYL CITRATE (PF) 100 MCG/2ML IJ SOLN
INTRAMUSCULAR | Status: AC
  Filled 2022-08-18: qty 2

## 2022-08-18 MED ORDER — SODIUM CHLORIDE 0.9 % WEIGHT BASED INFUSION: 1.0000 mL/kg/h | INTRAVENOUS | Status: DC

## 2022-08-18 MED ORDER — FENTANYL CITRATE (PF) 100 MCG/2ML IJ SOLN
INTRAMUSCULAR | Status: DC | PRN
  Administered 2022-08-18: 50 ug via INTRAVENOUS
  Administered 2022-08-18: 25 ug via INTRAVENOUS

## 2022-08-18 MED ORDER — LIDOCAINE HCL (PF) 1 % IJ SOLN
INTRAMUSCULAR | Status: DC | PRN
  Administered 2022-08-18 (×2): 2 mL

## 2022-08-18 MED ORDER — IOHEXOL 350 MG/ML SOLN
INTRAVENOUS | Status: DC | PRN
  Administered 2022-08-18: 80 mL

## 2022-08-18 MED ORDER — SODIUM CHLORIDE 0.9 % IV SOLN: INTRAVENOUS | Status: DC

## 2022-08-18 MED ORDER — HEPARIN SODIUM (PORCINE) 1000 UNIT/ML IJ SOLN
INTRAMUSCULAR | Status: DC | PRN
  Administered 2022-08-18: 2000 [IU] via INTRAVENOUS
  Administered 2022-08-18: 5000 [IU] via INTRAVENOUS

## 2022-08-18 MED ORDER — HYDRALAZINE HCL 20 MG/ML IJ SOLN: 10.0000 mg | INTRAMUSCULAR | Status: DC | PRN

## 2022-08-18 MED ORDER — LABETALOL HCL 5 MG/ML IV SOLN: 10.0000 mg | INTRAVENOUS | Status: DC | PRN

## 2022-08-18 MED ORDER — ONDANSETRON HCL 4 MG/2ML IJ SOLN: 4.0000 mg | Freq: Four times a day (QID) | INTRAMUSCULAR | Status: DC | PRN

## 2022-08-18 MED ORDER — LIDOCAINE HCL (PF) 1 % IJ SOLN
INTRAMUSCULAR | Status: AC
  Filled 2022-08-18: qty 30

## 2022-08-18 MED ORDER — MIDAZOLAM HCL 2 MG/2ML IJ SOLN
INTRAMUSCULAR | Status: DC | PRN
  Administered 2022-08-18: 2 mg via INTRAVENOUS
  Administered 2022-08-18: 1 mg via INTRAVENOUS

## 2022-08-18 MED ORDER — HEPARIN (PORCINE) IN NACL 1000-0.9 UT/500ML-% IV SOLN
INTRAVENOUS | Status: AC
  Filled 2022-08-18: qty 1000

## 2022-08-18 MED ORDER — VERAPAMIL HCL 2.5 MG/ML IV SOLN
INTRAVENOUS | Status: AC
  Filled 2022-08-18: qty 2

## 2022-08-18 MED ORDER — VERAPAMIL HCL 2.5 MG/ML IV SOLN
INTRAVENOUS | Status: DC | PRN
  Administered 2022-08-18: 10 mL via INTRA_ARTERIAL

## 2022-08-18 SURGICAL SUPPLY — 17 items
CATH 5FR JL3.5 JR4 ANG PIG MP (CATHETERS) IMPLANT
CATH BALLN WEDGE 5F 110CM (CATHETERS) IMPLANT
CATH LAUNCHER 5F EBU3.0 (CATHETERS) IMPLANT
CATHETER LAUNCHER 5F EBU3.0 (CATHETERS) ×1
DEVICE RAD COMP TR BAND LRG (VASCULAR PRODUCTS) IMPLANT
GLIDESHEATH SLEND SS 6F .021 (SHEATH) IMPLANT
GUIDEWIRE .025 260CM (WIRE) IMPLANT
GUIDEWIRE INQWIRE 1.5J.035X260 (WIRE) IMPLANT
INQWIRE 1.5J .035X260CM (WIRE) ×1
KIT ESSENTIALS PG (KITS) IMPLANT
KIT HEART LEFT (KITS) ×1 IMPLANT
PACK CARDIAC CATHETERIZATION (CUSTOM PROCEDURE TRAY) ×1 IMPLANT
SHEATH 6FR 85 DEST SLENDER (SHEATH) IMPLANT
SHEATH GLIDE SLENDER 4/5FR (SHEATH) IMPLANT
SYR MEDRAD MARK 7 150ML (SYRINGE) ×1 IMPLANT
TRANSDUCER W/STOPCOCK (MISCELLANEOUS) ×1 IMPLANT
TUBING CIL FLEX 10 FLL-RA (TUBING) ×1 IMPLANT

## 2022-08-18 NOTE — Interval H&P Note (Signed)
History and Physical Interval Note:  08/18/2022 8:54 AM  Vicki Rogers  has presented today for surgery, with the diagnosis of aortic stenosis.  The various methods of treatment have been discussed with the patient and family. After consideration of risks, benefits and other options for treatment, the patient has consented to  Procedure(s): RIGHT/LEFT HEART CATH AND CORONARY ANGIOGRAPHY (N/A) as a surgical intervention.  The patient's history has been reviewed, patient examined, no change in status, stable for surgery.  I have reviewed the patient's chart and labs.  Questions were answered to the patient's satisfaction.     Sherren Mocha

## 2022-08-18 NOTE — Discharge Instructions (Signed)

## 2022-08-31 ENCOUNTER — Encounter: Payer: Medicare HMO | Admitting: Surgery

## 2022-09-01 ENCOUNTER — Encounter: Payer: Medicare HMO | Admitting: Surgery

## 2022-09-24 NOTE — Progress Notes (Unsigned)
[address]       [place] [address]             [phone]           Sophonie Sox [facility] Medical Record [id number] Date of Birth: [birth year]  Elouise Munroe, MD Benito Mccreedy, MD  Chief Complaint: Tiredness  History of Present Illness:     Pt is a pleasant 77 yo female who has a history of PCI of LAD in 2019. She currently lives independently and uses a walker for ambulation. She admits to having to cut down the amount of walking over past few months due to "tiredness in her legs" and lower back pain. She denies DOE, lightheadedness, or CP. She has had a recent echo with mean gradient of 69mmHg and DI of 0.25 and AVA of 0.7cm2. She was felt to perhaps benefit from TAVR and CTA performed along with CATH. She has unobstructed CAD and access and sizing appropriate for TAVR. Her symptoms have been difficult to attribute to her AS however it sounds that with her increasing need to rest from her "tired legs" that her valve may be contributing. She has concerns about having procedure due to the risks discussed of potentially needing a PPM from her friend dying in 2015 following a PPM battery change and also concern over CVA and from having her groin shaved for cath, that discomfort from that is making her concerned to have the access for TAVR causing even more discomfort there.       Past Medical History:  Diagnosis Date   Aortic stenosis    Arthritis    Diabetes mellitus without complication ([place])    Diverticulosis    Hypertension    NSTEMI (non-ST elevated myocardial infarction) ([place])    Stroke [facility])     Past Surgical History:  Procedure Laterality Date   ABDOMINAL SURGERY     CORONARY STENT INTERVENTION N/A 04/08/2018   Procedure: CORONARY STENT INTERVENTION;  Surgeon: Sherren Mocha, MD;  Location: [place] [facility];  Service: Cardiovascular;  Laterality: N/A;   LEFT HEART CATH AND CORONARY ANGIOGRAPHY N/A 04/08/2018   Procedure: LEFT HEART  CATH AND CORONARY ANGIOGRAPHY;  Surgeon: Sherren Mocha, MD;  Location: [place];  Service: Cardiovascular;  Laterality: N/A;   RIGHT/LEFT HEART CATH AND CORONARY ANGIOGRAPHY N/A 08/18/2022   Procedure: RIGHT/LEFT HEART CATH AND CORONARY ANGIOGRAPHY;  Surgeon: Sherren Mocha, MD;  Location: [place] [facility];  Service: Cardiovascular;  Laterality: N/A;    Social History   Tobacco Use  Smoking Status Never  Smokeless Tobacco Never    Social History   Substance and Sexual Activity  Alcohol Use No    Social History   Socioeconomic History   Marital status: Widowed    Spouse name: Not on file   Number of children: Not on file   Years of education: Not on file   Highest education level: Not on file  Occupational History   Not on file  Tobacco Use   Smoking status: Never   Smokeless tobacco: Never  Vaping Use   Vaping Use: Never used  Substance and Sexual Activity   Alcohol use: No   Drug use: No   Sexual activity: Not on file  Other Topics Concern   Not on file  Social History Narrative   Not on file   Social Determinants of Health   Financial Resource Strain: Not on file  Food Insecurity: Not on file  Transportation Needs: Not on file  Physical Activity: Not on file  Stress: Not on file  Social Connections: Not on file  Intimate Partner Violence: Not on file    Allergies  Allergen Reactions   Penicillins Anaphylaxis    Has patient had a PCN reaction causing immediate rash, facial/tongue/throat swelling, SOB or lightheadedness with hypotension: YES Has patient had a PCN reaction causing severe rash involving mucus membranes or skin necrosis:NO Has patient had a PCN reaction that required hospitalization YES Has patient had a PCN reaction occurring within the last 10 years: NO If all of the above answers are "NO", then may proceed with Cephalosporin use.   Shrimp [Shellfish Allergy] Anaphylaxis   Metformin And Related Nausea And Vomiting    Pt  trialed Metformin 500 mg BID 09/2015, 2X hx vomiting; unwilling to retry.    Current Outpatient Medications  Medication Sig Dispense Refill   amLODipine-olmesartan (AZOR) 5-20 MG tablet Take 1 tablet by mouth daily.     aspirin 81 MG EC tablet Take 1 tablet (81 mg total) by mouth daily. 30 tablet 0   atorvastatin (LIPITOR) 80 MG tablet Take 80 mg by mouth daily.     carboxymethylcellul-glycerin (REFRESH OPTIVE) 0.5-0.9 % ophthalmic solution Place 1 drop into both eyes as needed for dry eyes.     ezetimibe (ZETIA) 10 MG tablet Take 10 mg by mouth daily.     glipiZIDE (GLUCOTROL XL) 5 MG 24 hr tablet Take 5 mg by mouth daily with breakfast.     metoprolol succinate (TOPROL-XL) 50 MG 24 hr tablet Take 50 mg by mouth daily. Take with or immediately following a meal.     nitroGLYCERIN (NITROSTAT) 0.4 MG SL tablet Place 1 tablet (0.4 mg total) under the tongue every 5 (five) minutes as needed for chest pain. 100 tablet 0   No current facility-administered medications for this visit.     Family History  Problem Relation Age of Onset   Diabetes Mother        Physical Exam: Teeth in good repair Lungs: clear Card: rr with 4/6 sem Ext: no edema Neuro: intact Ambulates with walker      Diagnostic Studies & Laboratory data: I have personally reviewed the following studies and agree with the findings   TTE (05/2022) IMPRESSIONS     1. Left ventricular ejection fraction, by estimation, is 60 to 65%. The  left ventricle has normal function. The left ventricle has no regional  wall motion abnormalities. Left ventricular diastolic parameters were  normal. The average left ventricular  global longitudinal strain is -20.5 %.   2. Right ventricular systolic function is normal. The right ventricular  size is normal. There is normal pulmonary artery systolic pressure. The  estimated right ventricular systolic pressure is Q000111Q mmHg.   3. The mitral valve is normal in structure. Trivial  mitral valve  regurgitation. No evidence of mitral stenosis.   4. The inferior vena cava is normal in size with greater than 50%  respiratory variability, suggesting right atrial pressure of 3 mmHg.   5. The aortic valve is calcified. There is severe calcifcation of the  aortic valve. Aortic valve regurgitation is trivial. Severe aortic valve  stenosis. Moderate AS by gradients (Vmax 3.5 m/s, MG 31 mmHg), severe AS  by AVA (0.7cm^2) and DI (0.22). 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 The left ventricle has normal function. The left ventricle has no  regional wall motion abnormalities. The average left ventricular global  longitudinal strain is -20.5 %.  The left ventricular internal cavity size was small. There is no left  ventricular hypertrophy. Left ventricular diastolic parameters were  normal.   Right Ventricle: The right ventricular size is normal. No increase in  right ventricular wall thickness. Right ventricular systolic function is  normal. There is normal pulmonary artery systolic pressure. The tricuspid  regurgitant velocity is 2.30 m/s, and   with an assumed right atrial pressure of 3 mmHg, the estimated right  ventricular systolic pressure is Q000111Q mmHg.   Left Atrium: Left atrial size was normal in size.   Right Atrium: Right atrial size was normal in size.   Pericardium: There is no evidence of pericardial effusion.   Mitral Valve: The mitral valve is normal in structure. Trivial mitral  valve regurgitation. No evidence of mitral valve stenosis.   Tricuspid Valve: The tricuspid valve is normal in structure. Tricuspid  valve regurgitation is trivial.   Aortic Valve: The aortic valve is calcified. There is severe calcifcation  of the aortic valve. Aortic valve regurgitation is trivial. Aortic  regurgitation PHT measures 879 msec.  Severe aortic stenosis is present.  Aortic valve mean gradient measures 24.0  mmHg. Aortic valve peak gradient measures 49.8 mmHg. Aortic valve area, by  VTI measures 1.00 cm.   Pulmonic Valve: The pulmonic valve was not well visualized. Pulmonic valve  regurgitation is trivial.   Aorta: The aortic root and ascending aorta are structurally normal, with  no evidence of dilitation.   Venous: The inferior vena cava is normal in size with greater than 50%  respiratory variability, suggesting right atrial pressure of 3 mmHg.   IAS/Shunts: The interatrial septum was not well visualized.     LEFT VENTRICLE  PLAX 2D  LVIDd:         3.60 cm   Diastology  LVIDs:         2.40 cm   LV e' medial:    6.85 cm/s  LV PW:         0.90 cm   LV E/e' medial:  12.1  LV IVS:        0.70 cm   LV e' lateral:   9.25 cm/s  LVOT diam:     2.00 cm   LV E/e' lateral: 8.9  LV SV:         66  LV SV Index:   32        2D Longitudinal Strain  LVOT Area:     3.14 cm  2D Strain GLS (A2C):   -23.4 %                           2D Strain GLS (A3C):   -21.8 %                           2D Strain GLS (A4C):   -16.4 %                           2D Strain GLS Avg:     -20.5 %   RIGHT VENTRICLE  RV Basal diam:  3.30 cm  RV S prime:     10.00 cm/s  TAPSE (M-mode): 1.9 cm  RVSP:  24.2 mmHg   LEFT ATRIUM             Index        RIGHT ATRIUM           Index  LA diam:        3.30 cm 1.60 cm/m   RA Pressure: 3.00 mmHg  LA Vol (A2C):   61.9 ml 29.99 ml/m  RA Area:     10.40 cm  LA Vol (A4C):   53.6 ml 25.97 ml/m  RA Volume:   21.40 ml  10.37 ml/m  LA Biplane Vol: 61.0 ml 29.56 ml/m   AORTIC VALVE  AV Area (Vmax):    0.74 cm  AV Area (Vmean):   0.81 cm  AV Area (VTI):     1.00 cm  AV Vmax:           353.00 cm/s  AV Vmean:          221.000 cm/s  AV VTI:            0.662 m  AV Peak Grad:      49.8 mmHg  AV Mean Grad:      24.0 mmHg  LVOT Vmax:         83.60 cm/s  LVOT Vmean:        57.000 cm/s  LVOT  VTI:          0.210 m  LVOT/AV VTI ratio: 0.32  AI PHT:            879 msec    AORTA  Ao Root diam: 2.80 cm  Ao Asc diam:  3.30 cm   MITRAL VALVE               TRICUSPID VALVE  MV Area (PHT):             TR Peak grad:   21.2 mmHg  MV Decel Time:             TR Vmax:        230.00 cm/s  MV E velocity: 82.70 cm/s  Estimated RAP:  3.00 mmHg  MV A velocity: 53.10 cm/s  RVSP:           24.2 mmHg  MV E/A ratio:  1.56                             SHUNTS                             Systemic VTI:  0.21 m                             Systemic Diam: 2.00 cm   CATH (08/2022) Conclusion  1.  Patent left main with no significant stenosis 2.  Patent LAD stent with mild to moderate diffuse in-stent restenosis estimated at 40% 3.  Patent left circumflex with mild irregularity and no significant stenoses 4.  Patent RCA with mild proximal stenosis and moderate mid to distal vessel stenosis, not significantly changed from the previous cath study. 5.  Severe aortic stenosis with mean gradient 32 mmHg and calculated aortic valve area 0.98 cm.  Fluoroscopically the aortic valve is calcified with restricted leaflet opening. 6.  Essentially normal right heart hemodynamics and preserved cardiac output    Recent Radiology Findings:   CTA (08/2022) FINDINGS: Image  quality: Excellent.   Noise artifact is: Limited.   Valve Morphology: Tricuspid aortic valve. Diffuse severe calcifications. Restricted leaflet movement in systole.   Aortic Valve Calcium score: 1290   Aortic annular dimension:   Phase assessed: 30%   Annular area: 397 mm2   Annular perimeter: 72.3 mm   Max diameter: 26.3 mm   Min diameter: 20.0 mm   Annular and subannular calcification: None.   Membranous septum length: 6.1 mm   Optimal coplanar projection: LAO 8 CRA 6   Coronary Artery Height above Annulus:   Left Main: 15.2 mm   Right Coronary: 16.3 mm   Sinus of Valsalva Measurements:   Non-coronary: 29 mm    Right-coronary: 29 mm   Left-coronary: 30 mm   Sinus of Valsalva Height:   Non-coronary: 21.0 mm   Right-coronary: 19.4 mm   Left-coronary: 20.1 mm   Sinotubular Junction: 28 mm   Ascending Thoracic Aorta: 36 mm   Coronary Arteries: Normal coronary origin. Right dominance. The study was performed without use of NTG and is insufficient for plaque evaluation. Please refer to recent cardiac catheterization for coronary assessment. LAD stent noted.   Cardiac Morphology:   Right Atrium: Right atrial size is within normal limits.   Right Ventricle: The right ventricular cavity is within normal limits.   Left Atrium: Left atrial size is normal in size with no left atrial appendage filling defect.   Left Ventricle: The ventricular cavity size is within normal limits.   Pulmonary arteries: Normal in size without proximal filling defect.   Pulmonary veins: Normal pulmonary venous drainage.   Pericardium: Normal thickness with no significant effusion or calcium present.   Mitral Valve: The mitral valve is normal structure without significant calcification.   Extra-cardiac findings: See attached radiology report for non-cardiac structures.   IMPRESSION: 1. Annular measurements favor a 23 mm S3 (397 mm2) or 29 mm Evolut Pro.   2. No significant annular or subannular calcifications.   3. Sufficient coronary to annulus distance.   4. Optimal Fluoroscopic Angle for Delivery: LAO 8 CRA 6      Recent Lab Findings: Lab Results  Component Value Date   WBC 6.8 08/09/2022   HGB 12.2 08/18/2022   HGB 12.2 08/18/2022   HCT 36.0 08/18/2022   HCT 36.0 08/18/2022   PLT 266 08/09/2022   GLUCOSE 77 08/09/2022   CHOL 134 06/11/2018   TRIG 62 06/11/2018   HDL 57 06/11/2018   LDLCALC 65 06/11/2018   ALT 16 08/17/2018   AST 23 08/17/2018   NA 143 08/18/2022   NA 142 08/18/2022   K 4.0 08/18/2022   K 4.0 08/18/2022   CL 105 08/09/2022   CREATININE 0.89 08/09/2022   BUN  14 08/09/2022   CO2 21 08/09/2022   TSH 1.943 04/07/2018   HGBA1C 6.4 (H) 04/07/2018      Assessment / Plan:     Pt with NYHA class 2 symptoms of severe AS and I feel that her tiredness of lower extremities most likely her equivilent to DOE. She had all the risks and goals and recovery from procedure discussed and she understands. She is finding it difficult to decide if she wants this  and is happy to take her time to decide. I feel that she at least should have cardiology follow up in a 3-4 months to have her evaluated and informed of our concerns for her moving forward with procedure. She is either a Sapien or Corevalve candidate and if a  lower PPM risk would make her happy to proceed then the Sapien would offer that. She is not a bail out candidate   I have spent 60 min in review of the records, viewing studies and in face to face with patient and in coordination of future care    Coralie Common 09/24/2022 10:25 AM

## 2022-09-25 ENCOUNTER — Institutional Professional Consult (permissible substitution): Payer: Medicare HMO | Admitting: Thoracic Surgery (Cardiothoracic Vascular Surgery)

## 2022-09-25 VITALS — BP 156/88 | HR 79 | Resp 20 | Ht 64.0 in | Wt 224.0 lb

## 2022-09-25 DIAGNOSIS — I350 Nonrheumatic aortic (valve) stenosis: Secondary | ICD-10-CM | POA: Diagnosis not present

## 2022-09-25 NOTE — Patient Instructions (Signed)
Consider TAVR F/u with Cardiology in 3-4 months

## 2022-10-23 ENCOUNTER — Ambulatory Visit: Payer: Medicare HMO | Admitting: Cardiovascular Disease

## 2022-11-10 ENCOUNTER — Telehealth: Payer: Self-pay

## 2022-11-10 NOTE — Telephone Encounter (Signed)
I attempted to reach the patient to arrange an appointment with Dr Excell Seltzer in July.  The patient needs continued follow up of valvular heart disease. No answer at this time.

## 2022-11-27 ENCOUNTER — Telehealth: Payer: Self-pay

## 2022-11-27 DIAGNOSIS — H524 Presbyopia: Secondary | ICD-10-CM | POA: Diagnosis not present

## 2022-11-27 DIAGNOSIS — H5213 Myopia, bilateral: Secondary | ICD-10-CM | POA: Diagnosis not present

## 2022-11-27 NOTE — Telephone Encounter (Signed)
I attempted to reach the patient to arrange an appointment with Dr Excell Seltzer in July. The patient needs continued follow up of valvular heart disease. No answer at this time. Letter previously mailed to the patient in March. I will update and mail a new letter to the patient's home.

## 2022-12-05 DIAGNOSIS — J302 Other seasonal allergic rhinitis: Secondary | ICD-10-CM | POA: Diagnosis not present

## 2022-12-05 DIAGNOSIS — E119 Type 2 diabetes mellitus without complications: Secondary | ICD-10-CM | POA: Diagnosis not present

## 2022-12-05 DIAGNOSIS — Z0001 Encounter for general adult medical examination with abnormal findings: Secondary | ICD-10-CM | POA: Diagnosis not present

## 2022-12-05 DIAGNOSIS — I10 Essential (primary) hypertension: Secondary | ICD-10-CM | POA: Diagnosis not present

## 2022-12-05 DIAGNOSIS — E782 Mixed hyperlipidemia: Secondary | ICD-10-CM | POA: Diagnosis not present

## 2022-12-19 DIAGNOSIS — I10 Essential (primary) hypertension: Secondary | ICD-10-CM | POA: Diagnosis not present

## 2022-12-19 DIAGNOSIS — J302 Other seasonal allergic rhinitis: Secondary | ICD-10-CM | POA: Diagnosis not present

## 2022-12-19 DIAGNOSIS — E119 Type 2 diabetes mellitus without complications: Secondary | ICD-10-CM | POA: Diagnosis not present

## 2022-12-19 DIAGNOSIS — E782 Mixed hyperlipidemia: Secondary | ICD-10-CM | POA: Diagnosis not present

## 2022-12-19 DIAGNOSIS — Z0001 Encounter for general adult medical examination with abnormal findings: Secondary | ICD-10-CM | POA: Diagnosis not present

## 2022-12-29 ENCOUNTER — Telehealth: Payer: Self-pay

## 2022-12-29 NOTE — Telephone Encounter (Signed)
I attempted to reach the patient to arrange an appointment with Dr Excell Seltzer in July. The patient needs continued follow up of valvular heart disease. No answer at this time. Reviewed most recent [place] and the patient does not have anyone listed for [place] to be able to discuss her care.

## 2023-01-29 NOTE — Progress Notes (Signed)
Dr Excell Seltzer was made aware that I have been unable to contact the patient by phone and she has not responded to the letters that have been mailed to her home. Dr Excell Seltzer and the structural heart team will wait for the patient to contact us or we will evaluate her further if she comes into the hospital.

## 2023-03-27 DIAGNOSIS — E782 Mixed hyperlipidemia: Secondary | ICD-10-CM | POA: Diagnosis not present

## 2023-03-27 DIAGNOSIS — E1165 Type 2 diabetes mellitus with hyperglycemia: Secondary | ICD-10-CM | POA: Diagnosis not present

## 2023-03-27 DIAGNOSIS — Z23 Encounter for immunization: Secondary | ICD-10-CM | POA: Diagnosis not present

## 2023-03-27 DIAGNOSIS — K219 Gastro-esophageal reflux disease without esophagitis: Secondary | ICD-10-CM | POA: Diagnosis not present

## 2023-03-27 DIAGNOSIS — Z6841 Body Mass Index (BMI) 40.0 and over, adult: Secondary | ICD-10-CM | POA: Diagnosis not present

## 2023-03-27 DIAGNOSIS — I25810 Atherosclerosis of coronary artery bypass graft(s) without angina pectoris: Secondary | ICD-10-CM | POA: Diagnosis not present

## 2023-03-27 DIAGNOSIS — J302 Other seasonal allergic rhinitis: Secondary | ICD-10-CM | POA: Diagnosis not present

## 2023-03-27 DIAGNOSIS — I10 Essential (primary) hypertension: Secondary | ICD-10-CM | POA: Diagnosis not present

## 2023-08-28 DIAGNOSIS — J0110 Acute frontal sinusitis, unspecified: Secondary | ICD-10-CM | POA: Diagnosis not present

## 2023-08-28 DIAGNOSIS — B349 Viral infection, unspecified: Secondary | ICD-10-CM | POA: Diagnosis not present

## 2023-08-28 DIAGNOSIS — U071 COVID-19: Secondary | ICD-10-CM | POA: Diagnosis not present

## 2023-10-16 DIAGNOSIS — E1165 Type 2 diabetes mellitus with hyperglycemia: Secondary | ICD-10-CM | POA: Diagnosis not present

## 2023-10-16 DIAGNOSIS — I10 Essential (primary) hypertension: Secondary | ICD-10-CM | POA: Diagnosis not present

## 2023-10-16 DIAGNOSIS — I25810 Atherosclerosis of coronary artery bypass graft(s) without angina pectoris: Secondary | ICD-10-CM | POA: Diagnosis not present

## 2023-10-16 DIAGNOSIS — Z0001 Encounter for general adult medical examination with abnormal findings: Secondary | ICD-10-CM | POA: Diagnosis not present

## 2023-10-16 DIAGNOSIS — K219 Gastro-esophageal reflux disease without esophagitis: Secondary | ICD-10-CM | POA: Diagnosis not present

## 2023-10-16 DIAGNOSIS — E782 Mixed hyperlipidemia: Secondary | ICD-10-CM | POA: Diagnosis not present

## 2023-10-16 DIAGNOSIS — J302 Other seasonal allergic rhinitis: Secondary | ICD-10-CM | POA: Diagnosis not present

## 2023-12-19 DIAGNOSIS — E1165 Type 2 diabetes mellitus with hyperglycemia: Secondary | ICD-10-CM | POA: Diagnosis not present

## 2023-12-19 DIAGNOSIS — E782 Mixed hyperlipidemia: Secondary | ICD-10-CM | POA: Diagnosis not present

## 2023-12-19 DIAGNOSIS — I25810 Atherosclerosis of coronary artery bypass graft(s) without angina pectoris: Secondary | ICD-10-CM | POA: Diagnosis not present

## 2023-12-19 DIAGNOSIS — Z0001 Encounter for general adult medical examination with abnormal findings: Secondary | ICD-10-CM | POA: Diagnosis not present

## 2023-12-19 DIAGNOSIS — J302 Other seasonal allergic rhinitis: Secondary | ICD-10-CM | POA: Diagnosis not present

## 2023-12-19 DIAGNOSIS — K219 Gastro-esophageal reflux disease without esophagitis: Secondary | ICD-10-CM | POA: Diagnosis not present

## 2023-12-19 DIAGNOSIS — I10 Essential (primary) hypertension: Secondary | ICD-10-CM | POA: Diagnosis not present

## 2023-12-29 DIAGNOSIS — E119 Type 2 diabetes mellitus without complications: Secondary | ICD-10-CM | POA: Diagnosis not present

## 2023-12-29 DIAGNOSIS — I10 Essential (primary) hypertension: Secondary | ICD-10-CM | POA: Diagnosis not present

## 2023-12-29 DIAGNOSIS — H5203 Hypermetropia, bilateral: Secondary | ICD-10-CM | POA: Diagnosis not present

## 2023-12-29 DIAGNOSIS — H524 Presbyopia: Secondary | ICD-10-CM | POA: Diagnosis not present

## 2023-12-29 DIAGNOSIS — E7801 Familial hypercholesterolemia: Secondary | ICD-10-CM | POA: Diagnosis not present

## 2024-03-26 DIAGNOSIS — E1165 Type 2 diabetes mellitus with hyperglycemia: Secondary | ICD-10-CM | POA: Diagnosis not present

## 2024-03-26 DIAGNOSIS — I10 Essential (primary) hypertension: Secondary | ICD-10-CM | POA: Diagnosis not present

## 2024-03-26 DIAGNOSIS — J302 Other seasonal allergic rhinitis: Secondary | ICD-10-CM | POA: Diagnosis not present

## 2024-03-26 DIAGNOSIS — K219 Gastro-esophageal reflux disease without esophagitis: Secondary | ICD-10-CM | POA: Diagnosis not present

## 2024-03-26 DIAGNOSIS — E782 Mixed hyperlipidemia: Secondary | ICD-10-CM | POA: Diagnosis not present

## 2024-03-26 DIAGNOSIS — I25810 Atherosclerosis of coronary artery bypass graft(s) without angina pectoris: Secondary | ICD-10-CM | POA: Diagnosis not present
